# Patient Record
Sex: Female | Born: 1995 | Hispanic: Yes | Marital: Single | Smoking: Never smoker
Health system: Southern US, Community
[De-identification: ages and names within clinical notes are randomized; demographics above are authoritative.]

## PROBLEM LIST (undated history)

## (undated) ENCOUNTER — Inpatient Hospital Stay: Payer: Self-pay

## (undated) ENCOUNTER — Inpatient Hospital Stay (HOSPITAL_COMMUNITY): Payer: Self-pay

## (undated) DIAGNOSIS — K759 Inflammatory liver disease, unspecified: Secondary | ICD-10-CM

## (undated) DIAGNOSIS — N912 Amenorrhea, unspecified: Secondary | ICD-10-CM

## (undated) DIAGNOSIS — Z9189 Other specified personal risk factors, not elsewhere classified: Secondary | ICD-10-CM

## (undated) DIAGNOSIS — A6000 Herpesviral infection of urogenital system, unspecified: Secondary | ICD-10-CM

## (undated) DIAGNOSIS — O24419 Gestational diabetes mellitus in pregnancy, unspecified control: Secondary | ICD-10-CM

## (undated) DIAGNOSIS — A64 Unspecified sexually transmitted disease: Secondary | ICD-10-CM

## (undated) DIAGNOSIS — Z8619 Personal history of other infectious and parasitic diseases: Secondary | ICD-10-CM

## (undated) DIAGNOSIS — Z8739 Personal history of other diseases of the musculoskeletal system and connective tissue: Secondary | ICD-10-CM

## (undated) HISTORY — DX: Herpesviral infection of urogenital system, unspecified: A60.00

## (undated) HISTORY — DX: Amenorrhea, unspecified: N91.2

## (undated) HISTORY — DX: Personal history of other diseases of the musculoskeletal system and connective tissue: Z87.39

## (undated) HISTORY — PX: OTHER SURGICAL HISTORY: SHX169

## (undated) HISTORY — DX: Unspecified sexually transmitted disease: A64

## (undated) HISTORY — DX: Gestational diabetes mellitus in pregnancy, unspecified control: O24.419

## (undated) HISTORY — DX: Other specified personal risk factors, not elsewhere classified: Z91.89

## (undated) HISTORY — DX: Personal history of other infectious and parasitic diseases: Z86.19

---
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
## 2017-01-03 ENCOUNTER — Ambulatory Visit: Payer: Self-pay | Admitting: Obstetrics and Gynecology

## 2017-02-13 ENCOUNTER — Encounter (HOSPITAL_COMMUNITY): Payer: Self-pay

## 2018-01-25 LAB — HM HIV SCREENING LAB: HM HIV Screening: NEGATIVE

## 2018-05-28 DIAGNOSIS — Z30018 Encounter for initial prescription of other contraceptives: Secondary | ICD-10-CM | POA: Diagnosis not present

## 2018-05-28 DIAGNOSIS — Z309 Encounter for contraceptive management, unspecified: Secondary | ICD-10-CM | POA: Diagnosis not present

## 2018-10-30 DIAGNOSIS — Z62810 Personal history of physical and sexual abuse in childhood: Secondary | ICD-10-CM

## 2018-10-31 ENCOUNTER — Encounter: Payer: Self-pay | Admitting: Physician Assistant

## 2018-10-31 ENCOUNTER — Ambulatory Visit (LOCAL_COMMUNITY_HEALTH_CENTER): Payer: Medicaid Other | Admitting: Physician Assistant

## 2018-10-31 ENCOUNTER — Other Ambulatory Visit: Payer: Self-pay

## 2018-10-31 ENCOUNTER — Ambulatory Visit: Payer: Medicaid Other

## 2018-10-31 VITALS — BP 112/73 | Ht 61.0 in | Wt 127.0 lb

## 2018-10-31 DIAGNOSIS — Z30013 Encounter for initial prescription of injectable contraceptive: Secondary | ICD-10-CM

## 2018-10-31 DIAGNOSIS — Z01419 Encounter for gynecological examination (general) (routine) without abnormal findings: Secondary | ICD-10-CM

## 2018-10-31 DIAGNOSIS — Z3009 Encounter for other general counseling and advice on contraception: Secondary | ICD-10-CM

## 2018-10-31 DIAGNOSIS — Z30012 Encounter for prescription of emergency contraception: Secondary | ICD-10-CM

## 2018-10-31 DIAGNOSIS — Z113 Encounter for screening for infections with a predominantly sexual mode of transmission: Secondary | ICD-10-CM

## 2018-10-31 LAB — WET PREP FOR TRICH, YEAST, CLUE
Trichomonas Exam: NEGATIVE
Yeast Exam: NEGATIVE

## 2018-10-31 LAB — PREGNANCY, URINE: Preg Test, Ur: NEGATIVE

## 2018-10-31 MED ORDER — MEDROXYPROGESTERONE ACETATE 150 MG/ML IM SUSP
150.0000 mg | Freq: Once | INTRAMUSCULAR | Status: AC
Start: 2018-10-31 — End: 2018-10-31
  Administered 2018-10-31: 150 mg via INTRAMUSCULAR

## 2018-10-31 MED ORDER — LEVONORGESTREL 1.5 MG PO TABS: 1.5000 mg | ORAL_TABLET | Freq: Once | ORAL | 0 refills | Status: AC

## 2018-10-31 MED ORDER — VALACYCLOVIR HCL 500 MG PO TABS: 500.0000 mg | ORAL_TABLET | Freq: Every day | ORAL | 12 refills | Status: DC

## 2018-10-31 NOTE — Progress Notes (Signed)
Pregnancy test negative today. Patient given Depo today, tolerated well. ECP prescription sent to pharmacy by provider. Consents signed. Patient instructed to use condoms with all sex for next 2 weeks, and to do home pregnancy test in 2 weeks--to call [facility] if positive.Jenetta Downer, RN
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
## 2019-01-16 NOTE — Addendum Note (Signed)
Addended by: Cletis Media on: 01/16/2019 08:30 AM   Modules accepted: Orders

## 2019-02-03 ENCOUNTER — Ambulatory Visit: Payer: Medicaid Other

## 2019-02-05 ENCOUNTER — Ambulatory Visit: Payer: Medicaid Other

## 2019-02-05 DIAGNOSIS — Z20828 Contact with and (suspected) exposure to other viral communicable diseases: Secondary | ICD-10-CM | POA: Diagnosis not present

## 2019-02-05 DIAGNOSIS — B349 Viral infection, unspecified: Secondary | ICD-10-CM | POA: Diagnosis not present

## 2019-02-05 DIAGNOSIS — R0989 Other specified symptoms and signs involving the circulatory and respiratory systems: Secondary | ICD-10-CM | POA: Diagnosis not present

## 2019-03-31 DIAGNOSIS — F321 Major depressive disorder, single episode, moderate: Secondary | ICD-10-CM | POA: Diagnosis not present

## 2019-03-31 DIAGNOSIS — C189 Malignant neoplasm of colon, unspecified: Secondary | ICD-10-CM | POA: Diagnosis not present

## 2019-03-31 DIAGNOSIS — Z0001 Encounter for general adult medical examination with abnormal findings: Secondary | ICD-10-CM | POA: Diagnosis not present

## 2019-03-31 DIAGNOSIS — N898 Other specified noninflammatory disorders of vagina: Secondary | ICD-10-CM | POA: Diagnosis not present

## 2019-03-31 DIAGNOSIS — Z0000 Encounter for general adult medical examination without abnormal findings: Secondary | ICD-10-CM | POA: Diagnosis not present

## 2019-03-31 DIAGNOSIS — C50919 Malignant neoplasm of unspecified site of unspecified female breast: Secondary | ICD-10-CM | POA: Diagnosis not present

## 2019-03-31 DIAGNOSIS — Z124 Encounter for screening for malignant neoplasm of cervix: Secondary | ICD-10-CM | POA: Diagnosis not present

## 2019-03-31 DIAGNOSIS — J45909 Unspecified asthma, uncomplicated: Secondary | ICD-10-CM | POA: Diagnosis not present

## 2019-03-31 DIAGNOSIS — C539 Malignant neoplasm of cervix uteri, unspecified: Secondary | ICD-10-CM | POA: Diagnosis not present

## 2019-03-31 DIAGNOSIS — B009 Herpesviral infection, unspecified: Secondary | ICD-10-CM | POA: Diagnosis not present

## 2019-03-31 DIAGNOSIS — Z113 Encounter for screening for infections with a predominantly sexual mode of transmission: Secondary | ICD-10-CM | POA: Diagnosis not present

## 2019-03-31 DIAGNOSIS — C4490 Unspecified malignant neoplasm of skin, unspecified: Secondary | ICD-10-CM | POA: Diagnosis not present

## 2019-06-11 DIAGNOSIS — Z20822 Contact with and (suspected) exposure to covid-19: Secondary | ICD-10-CM | POA: Diagnosis not present

## 2019-06-11 DIAGNOSIS — U071 COVID-19: Secondary | ICD-10-CM | POA: Diagnosis not present

## 2019-06-11 DIAGNOSIS — R0981 Nasal congestion: Secondary | ICD-10-CM | POA: Diagnosis not present

## 2019-06-11 DIAGNOSIS — R519 Headache, unspecified: Secondary | ICD-10-CM | POA: Diagnosis not present

## 2019-07-23 DIAGNOSIS — Z111 Encounter for screening for respiratory tuberculosis: Secondary | ICD-10-CM | POA: Diagnosis not present

## 2019-07-23 DIAGNOSIS — Z021 Encounter for pre-employment examination: Secondary | ICD-10-CM | POA: Diagnosis not present

## 2019-08-11 ENCOUNTER — Ambulatory Visit: Payer: Medicaid Other | Admitting: Physician Assistant

## 2019-08-11 ENCOUNTER — Other Ambulatory Visit: Payer: Self-pay

## 2019-08-11 ENCOUNTER — Encounter: Payer: Self-pay | Admitting: Physician Assistant

## 2019-08-11 DIAGNOSIS — N760 Acute vaginitis: Secondary | ICD-10-CM | POA: Diagnosis not present

## 2019-08-11 DIAGNOSIS — B9689 Other specified bacterial agents as the cause of diseases classified elsewhere: Secondary | ICD-10-CM

## 2019-08-11 DIAGNOSIS — Z113 Encounter for screening for infections with a predominantly sexual mode of transmission: Secondary | ICD-10-CM | POA: Diagnosis not present

## 2019-08-11 LAB — WET PREP FOR TRICH, YEAST, CLUE
Trichomonas Exam: NEGATIVE
Yeast Exam: NEGATIVE

## 2019-08-11 MED ORDER — METRONIDAZOLE 0.75 % VA GEL
1.0000 | Freq: Every day | VAGINAL | 0 refills | Status: AC
Start: 2019-08-11 — End: 2019-08-18
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
## 2019-08-11 NOTE — Progress Notes (Signed)
Wet mount reviewed, patient treated for BV with metro gel, per provider orders.Burt Knack, RN

## 2019-08-11 NOTE — Progress Notes (Signed)
Here for STD testing. Plans to schedule appointment for [facility] at a later date.Burt Knack, RN

## 2019-08-28 ENCOUNTER — Ambulatory Visit (INDEPENDENT_AMBULATORY_CARE_PROVIDER_SITE_OTHER): Payer: Medicaid Other | Admitting: Obstetrics and Gynecology

## 2019-08-28 ENCOUNTER — Encounter: Payer: Self-pay | Admitting: Obstetrics and Gynecology

## 2019-08-28 ENCOUNTER — Ambulatory Visit (INDEPENDENT_AMBULATORY_CARE_PROVIDER_SITE_OTHER): Payer: Medicaid Other

## 2019-08-28 ENCOUNTER — Other Ambulatory Visit: Payer: Self-pay

## 2019-08-28 VITALS — BP 107/71 | HR 93 | Ht 61.0 in | Wt 141.0 lb

## 2019-08-28 DIAGNOSIS — Z3A01 Less than 8 weeks gestation of pregnancy: Secondary | ICD-10-CM

## 2019-08-28 DIAGNOSIS — N912 Amenorrhea, unspecified: Secondary | ICD-10-CM | POA: Diagnosis not present
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
## 2019-09-24 ENCOUNTER — Encounter: Payer: Medicaid Other | Admitting: Obstetrics and Gynecology

## 2019-10-21 ENCOUNTER — Telehealth: Payer: Self-pay | Admitting: Family Medicine

## 2019-10-21 ENCOUNTER — Ambulatory Visit: Payer: Medicaid Other | Admitting: Family Medicine

## 2019-10-21 ENCOUNTER — Other Ambulatory Visit: Payer: Self-pay

## 2019-10-21 ENCOUNTER — Encounter: Payer: Self-pay | Admitting: Family Medicine

## 2019-10-21 VITALS — BP 111/72 | Ht 61.0 in | Wt 137.2 lb

## 2019-10-21 DIAGNOSIS — Z3201 Encounter for pregnancy test, result positive: Secondary | ICD-10-CM

## 2019-10-21 DIAGNOSIS — Z113 Encounter for screening for infections with a predominantly sexual mode of transmission: Secondary | ICD-10-CM | POA: Diagnosis not present

## 2019-10-21 DIAGNOSIS — Z3200 Encounter for pregnancy test, result unknown: Secondary | ICD-10-CM

## 2019-10-21 LAB — WET PREP FOR TRICH, YEAST, CLUE
Trichomonas Exam: NEGATIVE
Yeast Exam: NEGATIVE

## 2019-10-21 LAB — PREGNANCY, URINE: Preg Test, Ur: POSITIVE — AB

## 2019-10-21 MED ORDER — CLOTRIMAZOLE 1 % EX CREA: TOPICAL_CREAM | Freq: Two times a day (BID) | CUTANEOUS | Status: AC
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
## 2019-10-21 NOTE — Telephone Encounter (Signed)
TC to patient, returning her call. Patient was here for PT today and looking for [facility]. Patient states she was told by [facility] she would need a referral from [facility] to start [facility]. Patient states she has made an appointment at a different clinic and has no further questions.Marland KitchenMarland KitchenBurt Knack, RN

## 2019-10-21 NOTE — Telephone Encounter (Signed)
[facility] told pt she need a referral from [place] for OB care [phone]

## 2019-10-29 DIAGNOSIS — Z3401 Encounter for supervision of normal first pregnancy, first trimester: Secondary | ICD-10-CM | POA: Diagnosis not present

## 2019-10-29 DIAGNOSIS — O3680X1 Pregnancy with inconclusive fetal viability, fetus 1: Secondary | ICD-10-CM | POA: Diagnosis not present

## 2019-11-13 DIAGNOSIS — N6042 Mammary duct ectasia of left breast: Secondary | ICD-10-CM | POA: Diagnosis not present

## 2019-11-14 ENCOUNTER — Ambulatory Visit (INDEPENDENT_AMBULATORY_CARE_PROVIDER_SITE_OTHER): Payer: Medicaid Other | Admitting: Obstetrics and Gynecology

## 2019-11-14 ENCOUNTER — Encounter: Payer: Self-pay | Admitting: Obstetrics and Gynecology

## 2019-11-14 ENCOUNTER — Other Ambulatory Visit: Payer: Self-pay

## 2019-11-14 VITALS — BP 110/72 | HR 90 | Ht 61.0 in | Wt 131.8 lb

## 2019-11-14 DIAGNOSIS — N6002 Solitary cyst of left breast: Secondary | ICD-10-CM

## 2019-11-14 DIAGNOSIS — N912 Amenorrhea, unspecified: Secondary | ICD-10-CM | POA: Diagnosis not present

## 2019-11-14 LAB — POCT URINE PREGNANCY: Preg Test, Ur: POSITIVE — AB
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
## 2019-11-18 ENCOUNTER — Ambulatory Visit (INDEPENDENT_AMBULATORY_CARE_PROVIDER_SITE_OTHER): Payer: Medicaid Other

## 2019-11-18 DIAGNOSIS — Z3A09 9 weeks gestation of pregnancy: Secondary | ICD-10-CM | POA: Diagnosis not present

## 2019-11-18 DIAGNOSIS — N912 Amenorrhea, unspecified: Secondary | ICD-10-CM | POA: Diagnosis not present

## 2019-12-11 ENCOUNTER — Encounter: Payer: Medicaid Other | Admitting: Obstetrics and Gynecology

## 2019-12-11 ENCOUNTER — Encounter: Payer: Self-pay | Admitting: Obstetrics and Gynecology

## 2019-12-12 ENCOUNTER — Encounter: Payer: Medicaid Other | Admitting: Obstetrics and Gynecology

## 2019-12-23 DIAGNOSIS — M791 Myalgia, unspecified site: Secondary | ICD-10-CM | POA: Diagnosis not present

## 2019-12-23 DIAGNOSIS — L210 Seborrhea capitis: Secondary | ICD-10-CM | POA: Diagnosis not present

## 2019-12-30 ENCOUNTER — Encounter: Payer: Self-pay | Admitting: Obstetrics and Gynecology

## 2019-12-31 ENCOUNTER — Encounter: Payer: Self-pay | Admitting: Physician Assistant

## 2019-12-31 ENCOUNTER — Other Ambulatory Visit: Payer: Self-pay

## 2019-12-31 ENCOUNTER — Ambulatory Visit: Payer: Medicaid Other | Admitting: Physician Assistant

## 2019-12-31 DIAGNOSIS — Z113 Encounter for screening for infections with a predominantly sexual mode of transmission: Secondary | ICD-10-CM | POA: Diagnosis not present

## 2019-12-31 LAB — WET PREP FOR TRICH, YEAST, CLUE
Trichomonas Exam: NEGATIVE
Yeast Exam: NEGATIVE

## 2019-12-31 NOTE — Progress Notes (Signed)
Wet [place] results reviewed. Per standing orders no treatment indicated. Raeleigh Guinn, RN  

## 2020-01-01 ENCOUNTER — Encounter: Payer: Self-pay | Admitting: Physician Assistant
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
## 2020-03-03 DIAGNOSIS — R300 Dysuria: Secondary | ICD-10-CM | POA: Diagnosis not present

## 2020-03-03 DIAGNOSIS — Z113 Encounter for screening for infections with a predominantly sexual mode of transmission: Secondary | ICD-10-CM | POA: Diagnosis not present

## 2020-03-12 DIAGNOSIS — R319 Hematuria, unspecified: Secondary | ICD-10-CM | POA: Diagnosis not present

## 2020-03-12 DIAGNOSIS — S299XXA Unspecified injury of thorax, initial encounter: Secondary | ICD-10-CM | POA: Diagnosis not present

## 2020-03-12 DIAGNOSIS — T7491XA Unspecified adult maltreatment, confirmed, initial encounter: Secondary | ICD-10-CM | POA: Diagnosis not present

## 2020-04-03 DIAGNOSIS — H6982 Other specified disorders of Eustachian tube, left ear: Secondary | ICD-10-CM | POA: Diagnosis not present

## 2020-04-03 DIAGNOSIS — Z3200 Encounter for pregnancy test, result unknown: Secondary | ICD-10-CM | POA: Diagnosis not present

## 2020-04-03 DIAGNOSIS — J069 Acute upper respiratory infection, unspecified: Secondary | ICD-10-CM | POA: Diagnosis not present

## 2020-04-08 ENCOUNTER — Ambulatory Visit (LOCAL_COMMUNITY_HEALTH_CENTER): Payer: Medicaid Other

## 2020-04-08 ENCOUNTER — Other Ambulatory Visit: Payer: Self-pay

## 2020-04-08 VITALS — BP 110/80 | Ht 61.0 in | Wt 122.0 lb

## 2020-04-08 DIAGNOSIS — Z3201 Encounter for pregnancy test, result positive: Secondary | ICD-10-CM

## 2020-04-08 LAB — PREGNANCY, URINE: Preg Test, Ur: POSITIVE — AB

## 2020-04-08 MED ORDER — PRENATAL VITAMINS 28-0.8 MG PO TABS: 28.0000 mg | ORAL_TABLET | Freq: Every day | ORAL | 0 refills | Status: DC

## 2020-04-09 NOTE — Progress Notes (Signed)
+  PT today.  Plans [facility] at [facility] Campbell, RN

## 2020-04-28 ENCOUNTER — Other Ambulatory Visit (HOSPITAL_COMMUNITY)
Admission: RE | Admit: 2020-04-28 | Discharge: 2020-04-28 | Disposition: A | Discharge status: Home / Self Care | Payer: Medicaid Other | Source: Ambulatory Visit | Attending: Certified Nurse Midwife | Admitting: Certified Nurse Midwife

## 2020-04-28 ENCOUNTER — Ambulatory Visit (INDEPENDENT_AMBULATORY_CARE_PROVIDER_SITE_OTHER): Payer: Medicaid Other | Admitting: Certified Nurse Midwife

## 2020-04-28 ENCOUNTER — Other Ambulatory Visit: Payer: Self-pay

## 2020-04-28 ENCOUNTER — Ambulatory Visit (INDEPENDENT_AMBULATORY_CARE_PROVIDER_SITE_OTHER): Payer: Medicaid Other

## 2020-04-28 ENCOUNTER — Encounter: Payer: Self-pay | Admitting: Certified Nurse Midwife

## 2020-04-28 VITALS — BP 105/63 | HR 73 | Wt 120.4 lb

## 2020-04-28 DIAGNOSIS — N912 Amenorrhea, unspecified: Secondary | ICD-10-CM | POA: Diagnosis not present

## 2020-04-28 DIAGNOSIS — N898 Other specified noninflammatory disorders of vagina: Secondary | ICD-10-CM | POA: Insufficient documentation

## 2020-04-28 MED ORDER — DOXYLAMINE-PYRIDOXINE 10-10 MG PO TBEC: 1.0000 | DELAYED_RELEASE_TABLET | Freq: Four times a day (QID) | ORAL | 5 refills | Status: DC

## 2020-04-28 NOTE — Progress Notes (Signed)
Pt c/o increased yellow/white discharge, foul odor x 2 weeks Has used boric acid

## 2020-04-28 NOTE — Patient Instructions (Signed)

## 2020-04-28 NOTE — Progress Notes (Signed)
Subjective:    Angela Hicks is a 24 y.o. female who presents for evaluation of amenorrhea. Positive pregnancy test at the health department.. Pregnancy is desired. Sexual Activity: single partner, contraception: none. Current symptoms also include: fatigue, nausea and Vaginal discharge with odor. Has hx recurrent BV.Marland Kitchen Last period was normal.   No LMP recorded. The following portions of the patient's history were reviewed and updated as appropriate: allergies, current medications, past family history, past medical history, past social history, past surgical history and problem list.  Review of Systems Pertinent items are noted in HPI.     Objective:    BP 105/63   Pulse 73   Wt 120 lb 6.4 oz (54.6 kg)   BMI 22.75 kg/m  General: alert, cooperative, appears stated age and no acute distress    Lab Review Urine HCG: positive @ health department    Assessment:    Absence of menstruation.     Plan:  EDC: 12/16/20  . Sawb collected. Briefly discussed pre-natal care options. MD and midwifery care reviewed. Plans to see midwives. Encouraged well-balanced diet, plenty of rest when needed, pre-natal vitamins daily and walking for exercise. Discussed self-help for nausea (diclegis ordered), avoiding OTC medications until consulting provider or pharmacist, other than Tylenol as needed, minimal caffeine (1-2 cups daily) and avoiding alcohol. She will schedule u/s for dating,  her nurse visit is scheduled, her initial OB visit 12-13 wks.. Feel free to call with any questions.    Doreene Burke, CNM

## 2020-04-29 LAB — CERVICOVAGINAL ANCILLARY ONLY
Bacterial Vaginitis (gardnerella): POSITIVE — AB
Candida Glabrata: NEGATIVE
Candida Vaginitis: POSITIVE — AB
Chlamydia: NEGATIVE
Comment: NEGATIVE
Comment: NEGATIVE
Comment: NEGATIVE
Comment: NEGATIVE
Comment: NEGATIVE
Comment: NORMAL
Neisseria Gonorrhea: NEGATIVE
Trichomonas: NEGATIVE

## 2020-05-03 ENCOUNTER — Other Ambulatory Visit: Payer: Self-pay | Admitting: Certified Nurse Midwife

## 2020-05-03 ENCOUNTER — Other Ambulatory Visit: Payer: Self-pay

## 2020-05-03 ENCOUNTER — Telehealth: Payer: Self-pay

## 2020-05-03 MED ORDER — METRONIDAZOLE 0.75 % VA GEL: 1.0000 | Freq: Every day | VAGINAL | 1 refills | Status: DC

## 2020-05-03 MED ORDER — MICONAZOLE NITRATE 2 % VA CREA: 1.0000 | TOPICAL_CREAM | Freq: Every day | VAGINAL | 0 refills | Status: AC

## 2020-05-03 MED ORDER — METRONIDAZOLE 0.75 % EX GEL: 1.0000 "application " | Freq: Every day | CUTANEOUS | 0 refills | Status: AC

## 2020-05-03 NOTE — Telephone Encounter (Signed)
Pharmacy called in and stated that they received a request for miconazole and one for metronidazole. The pharmacy is wanting to know which one to fill.  Please advise.

## 2020-05-03 NOTE — Progress Notes (Signed)
Swab positive for BV and yeast. Orders placed for treatment.   Jahmarion Popoff, CNM  

## 2020-05-03 NOTE — Telephone Encounter (Signed)
This pt has yeast and BV so I ordered metrogel for BV,  And miconazole for the yeast. Not sure why the are asking which one to fill she needs both. No doing oral diflucan cause she is early in preg. Not recommended.  She will need to complete full course for BV, then due the miconazole after.  Can you follow up with her pharmacy.    Barnabas Lister

## 2020-05-04 ENCOUNTER — Telehealth: Payer: Self-pay

## 2020-05-04 ENCOUNTER — Other Ambulatory Visit: Payer: Self-pay | Admitting: Certified Nurse Midwife

## 2020-05-04 MED ORDER — ONDANSETRON 4 MG PO TBDP: 4.0000 mg | ORAL_TABLET | Freq: Three times a day (TID) | ORAL | 0 refills | Status: DC | PRN

## 2020-05-04 NOTE — Telephone Encounter (Signed)
mychart message sent to patient- zofran dissolving tablets sent to pharmacy listed per Doreene Burke CNM

## 2020-05-11 ENCOUNTER — Ambulatory Visit: Payer: Medicaid Other

## 2020-05-11 ENCOUNTER — Other Ambulatory Visit: Payer: Self-pay

## 2020-05-11 ENCOUNTER — Telehealth: Payer: Self-pay

## 2020-05-11 VITALS — BP 110/61 | HR 85 | Ht 61.0 in | Wt 120.2 lb

## 2020-05-11 DIAGNOSIS — Z3A08 8 weeks gestation of pregnancy: Secondary | ICD-10-CM

## 2020-05-11 NOTE — Progress Notes (Signed)
Saunders Glance presents for NOB nurse interview visit. Pregnancy confirmation done 04/28/20 Doreene Burke CNM. LMP 03/11/20. Dating and viability ultrasound done at [facility] 04/28/20- 6 weeks 3 days EDD 12/19/20.  G-3 P1 0 1 1. Pregnancy education material explained and given. 0 cats in the home. NOB labs ordered. HIV labs and Drug screen were explained optional and she did not decline. Drug screen ordered. PNV encouraged. Genetic screening options discussed. Genetic testing: will be ordered at NOB PE.  Pt may discuss with provider. Pt. To follow up with provider in _4_ weeks for NOB physical.  All questions answered. FMLA paperwork signed. Financial policy reviewed and understood.

## 2020-05-11 NOTE — Telephone Encounter (Signed)
mychart message sent to patient re: no visitors 

## 2020-05-12 LAB — URINALYSIS, ROUTINE W REFLEX MICROSCOPIC
Bilirubin, UA: NEGATIVE
Glucose, UA: NEGATIVE
Ketones, UA: NEGATIVE
Nitrite, UA: NEGATIVE
RBC, UA: NEGATIVE
Specific Gravity, UA: 1.025 (ref 1.005–1.030)
Urobilinogen, Ur: 0.2 mg/dL (ref 0.2–1.0)
pH, UA: 8.5 — ABNORMAL HIGH (ref 5.0–7.5)

## 2020-05-12 LAB — MICROSCOPIC EXAMINATION
Bacteria, UA: NONE SEEN
Casts: NONE SEEN /lpf
Epithelial Cells (non renal): >10 /hpf — AB (ref 0–10)

## 2020-05-12 LAB — HCV INTERPRETATION

## 2020-05-12 LAB — ABO AND RH: Rh Factor: POSITIVE

## 2020-05-12 LAB — VARICELLA ZOSTER ANTIBODY, IGG: Varicella zoster IgG: 351 index (ref >165)

## 2020-05-12 LAB — VIRAL HEPATITIS HBV, HCV
HCV Ab: <0.1 s/co ratio (ref 0.0–0.9)
Hep B Core Total Ab: NEGATIVE
Hep B Surface Ab, Qual: REACTIVE
Hepatitis B Surface Ag: NEGATIVE

## 2020-05-12 LAB — HIV ANTIBODY (ROUTINE TESTING W REFLEX): HIV Screen 4th Generation wRfx: NONREACTIVE

## 2020-05-12 LAB — ANTIBODY SCREEN: Antibody Screen: NEGATIVE

## 2020-05-12 LAB — RPR: RPR Ser Ql: NONREACTIVE

## 2020-05-12 LAB — RUBELLA SCREEN: Rubella Antibodies, IGG: 3.25 index (ref >0.99)

## 2020-05-13 ENCOUNTER — Other Ambulatory Visit: Payer: Self-pay | Admitting: Certified Nurse Midwife

## 2020-05-13 DIAGNOSIS — Z3481 Encounter for supervision of other normal pregnancy, first trimester: Secondary | ICD-10-CM

## 2020-05-13 DIAGNOSIS — R8271 Bacteriuria: Secondary | ICD-10-CM | POA: Insufficient documentation

## 2020-05-13 DIAGNOSIS — Z3A08 8 weeks gestation of pregnancy: Secondary | ICD-10-CM

## 2020-05-13 LAB — URINE CULTURE

## 2020-05-13 LAB — GC/CHLAMYDIA PROBE AMP
Chlamydia trachomatis, NAA: NEGATIVE
Neisseria Gonorrhoeae by PCR: NEGATIVE

## 2020-05-13 MED ORDER — AMPICILLIN 500 MG PO CAPS: 500.0000 mg | ORAL_CAPSULE | Freq: Four times a day (QID) | ORAL | 0 refills | Status: AC

## 2020-05-27 LAB — CANNABINOID (GC/MS), URINE
Cannabinoid: POSITIVE — AB
Carboxy THC (GC/MS): 137 ng/mL

## 2020-05-27 LAB — MONITOR DRUG PROFILE 14(MW)
Amphetamine Scrn, Ur: NEGATIVE ng/mL
BARBITURATE SCREEN URINE: NEGATIVE ng/mL
BENZODIAZEPINE SCREEN, URINE: NEGATIVE ng/mL
Buprenorphine, Urine: NEGATIVE ng/mL
Cocaine (Metab) Scrn, Ur: NEGATIVE ng/mL
Creatinine(Crt), U: 197.4 mg/dL (ref 20.0–300.0)
Fentanyl, Urine: NEGATIVE pg/mL
Meperidine Screen, Urine: NEGATIVE ng/mL
Methadone Screen, Urine: NEGATIVE ng/mL
OXYCODONE+OXYMORPHONE UR QL SCN: NEGATIVE ng/mL
Opiate Scrn, Ur: NEGATIVE ng/mL
Ph of Urine: 8.9 (ref 4.5–8.9)
Phencyclidine Qn, Ur: NEGATIVE ng/mL
Propoxyphene Scrn, Ur: NEGATIVE ng/mL
SPECIFIC GRAVITY: 1.028
Tramadol Screen, Urine: NEGATIVE ng/mL

## 2020-06-03 DIAGNOSIS — Z3A11 11 weeks gestation of pregnancy: Secondary | ICD-10-CM | POA: Diagnosis not present

## 2020-06-03 DIAGNOSIS — N83291 Other ovarian cyst, right side: Secondary | ICD-10-CM | POA: Diagnosis not present

## 2020-06-03 DIAGNOSIS — R1030 Lower abdominal pain, unspecified: Secondary | ICD-10-CM | POA: Diagnosis not present

## 2020-06-03 DIAGNOSIS — O26899 Other specified pregnancy related conditions, unspecified trimester: Secondary | ICD-10-CM | POA: Diagnosis not present

## 2020-06-03 DIAGNOSIS — B3789 Other sites of candidiasis: Secondary | ICD-10-CM | POA: Diagnosis not present

## 2020-06-03 DIAGNOSIS — R1031 Right lower quadrant pain: Secondary | ICD-10-CM | POA: Diagnosis not present

## 2020-06-03 DIAGNOSIS — O210 Mild hyperemesis gravidarum: Secondary | ICD-10-CM | POA: Diagnosis not present

## 2020-06-03 DIAGNOSIS — O219 Vomiting of pregnancy, unspecified: Secondary | ICD-10-CM | POA: Diagnosis not present

## 2020-06-03 DIAGNOSIS — M545 Low back pain, unspecified: Secondary | ICD-10-CM | POA: Diagnosis not present

## 2020-06-03 DIAGNOSIS — O99891 Other specified diseases and conditions complicating pregnancy: Secondary | ICD-10-CM | POA: Diagnosis not present

## 2020-06-03 DIAGNOSIS — O23591 Infection of other part of genital tract in pregnancy, first trimester: Secondary | ICD-10-CM | POA: Diagnosis not present

## 2020-06-03 DIAGNOSIS — R109 Unspecified abdominal pain: Secondary | ICD-10-CM | POA: Diagnosis not present

## 2020-06-03 DIAGNOSIS — N898 Other specified noninflammatory disorders of vagina: Secondary | ICD-10-CM | POA: Diagnosis not present

## 2020-06-08 ENCOUNTER — Encounter: Payer: Self-pay | Admitting: Certified Nurse Midwife

## 2020-06-08 ENCOUNTER — Other Ambulatory Visit: Payer: Self-pay

## 2020-06-08 ENCOUNTER — Ambulatory Visit (INDEPENDENT_AMBULATORY_CARE_PROVIDER_SITE_OTHER): Payer: Medicaid Other | Admitting: Certified Nurse Midwife

## 2020-06-08 VITALS — BP 110/69 | HR 95 | Wt 116.2 lb

## 2020-06-08 DIAGNOSIS — Z3482 Encounter for supervision of other normal pregnancy, second trimester: Secondary | ICD-10-CM | POA: Diagnosis not present

## 2020-06-08 DIAGNOSIS — N390 Urinary tract infection, site not specified: Secondary | ICD-10-CM | POA: Diagnosis not present

## 2020-06-08 DIAGNOSIS — Z3A12 12 weeks gestation of pregnancy: Secondary | ICD-10-CM | POA: Diagnosis not present

## 2020-06-08 LAB — POCT URINALYSIS DIPSTICK OB
Bilirubin, UA: NEGATIVE
Blood, UA: NEGATIVE
Glucose, UA: NEGATIVE
Ketones, UA: NEGATIVE
Leukocytes, UA: NEGATIVE
Nitrite, UA: NEGATIVE
POC,PROTEIN,UA: NEGATIVE
Spec Grav, UA: 1.01 (ref 1.010–1.025)
Urobilinogen, UA: 0.2 E.U./dL
pH, UA: 5 (ref 5.0–8.0)

## 2020-06-08 MED ORDER — FUSION PLUS PO CAPS: 1.0000 | ORAL_CAPSULE | Freq: Every day | ORAL | 11 refills | Status: DC

## 2020-06-08 NOTE — Progress Notes (Signed)
NEW OB HISTORY AND PHYSICAL  SUBJECTIVE:       Angela Hicks is a 25 y.o. G69P1011 female, Patient's last menstrual period was 03/11/2020 (exact date)., Estimated Date of Delivery: 12/19/20, [redacted]w[redacted]d, presents today for establishment of Prenatal Care. She and complains of nausea. Was seen a [facility] recently and given IV hydration.     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   Gynecologic History Patient's last menstrual period was 03/11/2020 (exact date). Normal Contraception: none Last Pap: 03/31/19. Results were: normal  Obstetric History OB History  Gravida Para Term Preterm AB Living  3 1 1  0 1 1  SAB IAB Ectopic Multiple Live Births  1 0 0 0 1    # Outcome Date GA Lbr Len/2nd Weight Sex Delivery Anes PTL Lv  3 Current           2 SAB 2021 [redacted]w[redacted]d         1 Term 11/20/16 [redacted]w[redacted]d  7 lb 2.5 oz (3.246 kg) F Vag-Spont EPI N LIV    Past Medical History:  Diagnosis Date  . Amenorrhea   . At risk for sexually transmitted disease due to partner with genital symptoms    partner tested positive, pt tested negative  . Genital herpes   . Hepatitis    valtrex x 3 days  . History of TMJ disorder   . Hx of chlamydia infection   . STD (sexually transmitted disease)     Past Surgical History:  Procedure Laterality Date  . none      Current Outpatient Medications on File Prior to Visit  Medication Sig Dispense Refill  . clotrimazole (GYNE-LOTRIMIN) 1 % vaginal cream Place vaginally.    . Prenatal MV & Min w/FA-DHA (PRENATAL GUMMIES PO) Take by mouth.    . Doxylamine-Pyridoxine 10-10 MG TBEC Take 1 tablet by mouth 4 (four) times daily. Day 1 &2: 2 tablet at bedtimeDay 3 : if symptoms persists 1 tablet am; 2 tablet at bedtimeDay 4: 1 tablet am, 1 tab afternoon, 2 tab at bedtime (Patient not taking: Reported on  06/08/2020) 120 tablet 5  . ondansetron (ZOFRAN ODT) 4 MG disintegrating tablet Take 1 tablet (4 mg total) by mouth every 8 (eight) hours as needed for nausea or vomiting. (Patient not taking: No sig reported) 20 tablet 0   No current facility-administered medications on file prior to visit.    No Known Allergies  Social History   Socioeconomic History  . Marital status: Single    Spouse name: Not on file  . Number of children: Not on file  . Years of education: Not on file  . Highest education level: Not on file  Occupational History  . Not on file  Tobacco Use  . Smoking status: Never Smoker  . Smokeless tobacco: Never Used  Vaping Use  . Vaping Use: Never used  Substance and Sexual Activity  . Alcohol use: Not Currently  . Drug use: No  . Sexual activity: Yes    Partners: Male    Birth control/protection: None  Other Topics Concern  . Not on file  Social History Narrative  . Not on file   Social Determinants of Health   Financial Resource Strain: Not on file  Food Insecurity: Not on file  Transportation Needs: Not on file  Physical Activity: Not on file  Stress: Not on file  Social Connections: Not on file  Intimate Partner Violence: Not At Risk  . Fear of Current or Ex-Partner: No  . Emotionally Abused: No  . Physically Abused: No  . Sexually Abused: No    Family History  Problem Relation Age of Onset  . Hypertension Mother   . Migraines Mother   . Hyperlipidemia Mother   . Diabetes Mother   . Thyroid disease Father   . Cirrhosis Father   . Migraines Sister   . Breast cancer Maternal Aunt 40  . Migraines Paternal Aunt   . Breast cancer Maternal Grandmother   . Ovarian cancer Maternal Grandmother   . Cancer Maternal Grandfather        colon  . Lung cancer Paternal Grandfather     The following portions of the patient's history were reviewed and updated as appropriate: allergies, current medications, past OB history, past medical history, past  surgical history, past family history, past social history, and problem list.    OBJECTIVE: Initial Physical Exam (New OB)  GENERAL APPEARANCE: alert, well appearing, in no apparent distress, oriented to person, place and time HEAD: normocephalic, atraumatic MOUTH: mucous membranes moist, pharynx normal without lesions THYROID: no thyromegaly or masses present BREASTS: no masses noted, no significant tenderness, no palpable axillary nodes, no skin changes LUNGS: clear to auscultation, no wheezes, rales or rhonchi, symmetric air entry HEART: regular rate and rhythm, no murmurs ABDOMEN: soft, nontender, nondistended, no abnormal masses, no epigastric pain and FHT present EXTREMITIES: no redness or tenderness in the calves or thighs, no edema, no limitation in range of motion, intact peripheral pulses SKIN: normal coloration and turgor, no rashes LYMPH NODES: no adenopathy palpable NEUROLOGIC: alert, oriented, normal speech, no focal findings or movement disorder noted  PELVIC EXAM EXTERNAL GENITALIA: normal appearing vulva with no masses, tenderness or lesions VAGINA: no abnormal discharge or lesions CERVIX: no lesions or cervical motion tenderness UTERUS: gravid ADNEXA: no masses palpable and nontender OB EXAM PELVIMETRY: appears adequate RECTUM: exam not indicated  ASSESSMENT: Normal pregnancy  PLAN: New OB counseling: The patient has been given an overview regarding routine prenatal care. Recommendations regarding diet, weight gain, and exercise in pregnancy were given. Prenatal testing, optional genetic testing, carrier screening testing, and ultrasound use in pregnancy were reviewed.  [place] today . Benefits of Breast Feeding were discussed. The patient is encouraged to consider nursing her baby post partum. State she did it x 1 month with her first baby. Plans to do it longer. Follow up 4 wks with Marcelino Duster.   Doreene Burke, CNM

## 2020-06-08 NOTE — Patient Instructions (Signed)
https://www.acog.org/womens-health/faqs/prenatal-genetic-screening-tests">  Prenatal Care Prenatal care is health care during pregnancy. It helps you and your unborn baby (fetus) stay as healthy as possible. Prenatal care may be provided by a midwife, a family practice doctor, a mid-level practitioner (nurse practitioner or physician assistant), or a childbirth and pregnancy doctor (obstetrician). How does this affect me? During pregnancy, you will be closely monitored for any new conditions that might develop. To lower your risk of pregnancy complications, you and your health care provider will talk about any underlying conditions you have. How does this affect my baby? Early and consistent prenatal care increases the chance that your baby will be healthy during pregnancy. Prenatal care lowers the risk that your baby will be:  Born early (prematurely).  Smaller than expected at birth (small for gestational age). What can I expect at the first prenatal care visit? Your first prenatal care visit will likely be the longest. You should schedule your first prenatal care visit as soon as you know that you are pregnant. Your first visit is a good time to talk about any questions or concerns you have about pregnancy. Medical history At your visit, you and your health care provider will talk about your medical history, including:  Any past pregnancies.  Your family's medical history.  Medical history of the baby's father.  Any long-term (chronic) health conditions you have and how you manage them.  Any surgeries or procedures you have had.  Any current over-the-counter or prescription medicines, herbs, or supplements that you are taking.  Other factors that could pose a risk to your baby, including: ? Exposure to harmful chemicals or radiation at work or at home. ? Any substance use, including tobacco, alcohol, and drug use.  Your home setting and your stress levels, including: ? Exposure to  abuse or violence. ? Household financial strain.  Your daily health habits, including diet and exercise. Tests and screenings Your health care provider will:  Measure your weight, height, and blood pressure.  Do a physical exam, including a pelvic and breast exam.  Perform blood tests and urine tests to check for: ? Urinary tract infection. ? Sexually transmitted infections (STIs). ? Low iron levels in your blood (anemia). ? Blood type and certain proteins on red blood cells (Rh antibodies). ? Infections and immunity to viruses, such as hepatitis B and rubella. ? HIV (human immunodeficiency virus).  Discuss your options for genetic screening. Tips about staying healthy Your health care provider will also give you information about how to keep yourself and your baby healthy, including:  Nutrition and taking vitamins.  Physical activity.  How to manage pregnancy symptoms such as nausea and vomiting (morning sickness).  Infections and substances that may be harmful to your baby and how to avoid them.  Food safety.  Dental care.  Working.  Travel.  Warning signs to watch for and when to call your health care provider. How often will I have prenatal care visits? After your first prenatal care visit, you will have regular visits throughout your pregnancy. The visit schedule is often as follows:  Up to week 28 of pregnancy: once every 4 weeks.  28-36 weeks: once every 2 weeks.  After 36 weeks: every week until delivery. Some women may have visits more or less often depending on any underlying health conditions and the health of the baby. Keep all follow-up and prenatal care visits. This is important. What happens during routine prenatal care visits? Your health care provider will:  Measure your weight   and blood pressure.  Check for fetal heart sounds.  Measure the height of your uterus in your abdomen (fundal height). This may be measured starting around week 20 of  pregnancy.  Check the position of your baby inside your uterus.  Ask questions about your diet, sleeping patterns, and whether you can feel the baby move.  Review warning signs to watch for and signs of labor.  Ask about any pregnancy symptoms you are having and how you are dealing with them. Symptoms may include: ? Headaches. ? Nausea and vomiting. ? Vaginal discharge. ? Swelling. ? Fatigue. ? Constipation. ? Changes in your vision. ? Feeling persistently sad or anxious. ? Any discomfort, including back or pelvic pain. ? Bleeding or spotting. Make a list of questions to ask your health care provider at your routine visits.   What tests might I have during prenatal care visits? You may have blood, urine, and imaging tests throughout your pregnancy, such as:  Urine tests to check for glucose, protein, or signs of infection.  Glucose tests to check for a form of diabetes that can develop during pregnancy (gestational diabetes mellitus). This is usually done around week 24 of pregnancy.  Ultrasounds to check your baby's growth and development, to check for birth defects, and to check your baby's well-being. These can also help to decide when you should deliver your baby.  A test to check for group B strep (GBS) infection. This is usually done around week 36 of pregnancy.  Genetic testing. This may include blood, fluid, or tissue sampling, or imaging tests, such as an ultrasound. Some genetic tests are done during the first trimester and some are done during the second trimester. What else can I expect during prenatal care visits? Your health care provider may recommend getting certain vaccines during pregnancy. These may include:  A yearly flu shot (annual influenza vaccine). This is especially important if you will be pregnant during flu season.  Tdap (tetanus, diphtheria, pertussis) vaccine. Getting this vaccine during pregnancy can protect your baby from whooping cough  (pertussis) after birth. This vaccine may be recommended between weeks 27 and 36 of pregnancy.  A COVID-19 vaccine. Later in your pregnancy, your health care provider may give you information about:  Childbirth and breastfeeding classes.  Choosing a health care provider for your baby.  Umbilical cord banking.  Breastfeeding.  Birth control after your baby is born.  The hospital labor and delivery unit and how to set up a tour.  Registering at the hospital before you go into labor. Where to find more information  Office on Women's Health: womenshealth.gov  American Pregnancy Association: americanpregnancy.org  March of Dimes: marchofdimes.org Summary  Prenatal care helps you and your baby stay as healthy as possible during pregnancy.  Your first prenatal care visit will most likely be the longest.  You will have visits and tests throughout your pregnancy to monitor your health and your baby's health.  Bring a list of questions to your visits to ask your health care provider.  Make sure to keep all follow-up and prenatal care visits. This information is not intended to replace advice given to you by your health care provider. Make sure you discuss any questions you have with your health care provider. Document Revised: 01/29/2020 Document Reviewed: 01/29/2020 Elsevier Patient Education  2021 Elsevier Inc.  

## 2020-06-09 LAB — CBC
Hematocrit: 34.7 % (ref 34.0–46.6)
Hemoglobin: 10.9 g/dL — ABNORMAL LOW (ref 11.1–15.9)
MCH: 23.4 pg — ABNORMAL LOW (ref 26.6–33.0)
MCHC: 31.4 g/dL — ABNORMAL LOW (ref 31.5–35.7)
MCV: 75 fL — ABNORMAL LOW (ref 79–97)
Platelets: 284 10*3/uL (ref 150–450)
RBC: 4.66 x10E6/uL (ref 3.77–5.28)
RDW: 17.4 % — ABNORMAL HIGH (ref 11.7–15.4)
WBC: 9.3 10*3/uL (ref 3.4–10.8)

## 2020-06-10 LAB — URINE CULTURE

## 2020-07-09 ENCOUNTER — Ambulatory Visit (INDEPENDENT_AMBULATORY_CARE_PROVIDER_SITE_OTHER): Payer: Medicaid Other | Admitting: Certified Nurse Midwife

## 2020-07-09 ENCOUNTER — Other Ambulatory Visit: Payer: Self-pay

## 2020-07-09 ENCOUNTER — Encounter: Payer: Self-pay | Admitting: Certified Nurse Midwife

## 2020-07-09 VITALS — BP 105/64 | HR 94 | Wt 121.8 lb

## 2020-07-09 DIAGNOSIS — Z87440 Personal history of urinary (tract) infections: Secondary | ICD-10-CM

## 2020-07-09 DIAGNOSIS — Z3492 Encounter for supervision of normal pregnancy, unspecified, second trimester: Secondary | ICD-10-CM

## 2020-07-09 DIAGNOSIS — Z3A16 16 weeks gestation of pregnancy: Secondary | ICD-10-CM

## 2020-07-09 DIAGNOSIS — Z3689 Encounter for other specified antenatal screening: Secondary | ICD-10-CM

## 2020-07-09 LAB — POCT URINALYSIS DIPSTICK OB
Bilirubin, UA: NEGATIVE
Blood, UA: NEGATIVE
Glucose, UA: NEGATIVE
Ketones, UA: NEGATIVE
Leukocytes, UA: NEGATIVE
Nitrite, UA: NEGATIVE
POC,PROTEIN,UA: NEGATIVE
Spec Grav, UA: 1.015
Urobilinogen, UA: 0.2 U/dL
pH, UA: 7.5

## 2020-07-09 NOTE — Progress Notes (Signed)
ROB-Reports intermittent cramping, tachycardia, and pelvic pressure. Discussed home treatment measures including use of abdominal support. Emphasized the importance of eating and hydration during pregnancy. Urine culture today, due to history of UTI at intake. Anticipatory guidance regarding course of prenatal care. Reviewed red flag symptoms and when to call. RTC x 4 weeks for ANATOMY SCAN and ROB or sooner if needed.

## 2020-07-09 NOTE — Patient Instructions (Signed)
Second Trimester of Pregnancy  The second trimester of pregnancy is from week 13 through week 27. This is also called months 4 through 6 of pregnancy. This is often the time when you feel your best. During the second trimester:  Morning sickness is less or has stopped.  You may have more energy.  You may feel hungry more often. At this time, your unborn baby (fetus) is growing very fast. At the end of the sixth month, the unborn baby may be up to 12 inches long and weigh about 1 pounds. You will likely start to feel the baby move between 16 and 20 weeks of pregnancy. Body changes during your second trimester Your body continues to go through many changes during this time. The changes vary and generally return to normal after the baby is born. Physical changes  You will gain more weight.  You may start to get stretch marks on your hips, belly (abdomen), and breasts.  Your breasts will grow and may hurt.  Dark spots or blotches may develop on your face.  A dark line from your belly button to the pubic area (linea nigra) may appear.  You may have changes in your hair. Health changes  You may have headaches.  You may have heartburn.  You may have trouble pooping (constipation).  You may have hemorrhoids or swollen, bulging veins (varicose veins).  Your gums may bleed.  You may pee (urinate) more often.  You may have back pain. Follow these instructions at home: Medicines  Take over-the-counter and prescription medicines only as told by your doctor. Some medicines are not safe during pregnancy.  Take a prenatal vitamin that contains at least 600 micrograms (mcg) of folic acid. Eating and drinking  Eat healthy meals that include: ? Fresh fruits and vegetables. ? Whole grains. ? Good sources of protein, such as meat, eggs, or tofu. ? Low-fat dairy products.  Avoid raw meat and unpasteurized juice, milk, and cheese.  You may need to take these actions to prevent or  treat trouble pooping: ? Drink enough fluids to keep your pee (urine) pale yellow. ? Eat foods that are high in fiber. These include beans, whole grains, and fresh fruits and vegetables. ? Limit foods that are high in fat and sugar. These include fried or sweet foods. Activity  Exercise only as told by your doctor. Most people can do their usual exercise during pregnancy. Try to exercise for 30 minutes at least 5 days a week.  Stop exercising if you have pain or cramps in your belly or lower back.  Do not exercise if it is too hot or too humid, or if you are in a place of great height (high altitude).  Avoid heavy lifting.  If you choose to, you may have sex unless your doctor tells you not to. Relieving pain and discomfort  Wear a good support bra if your breasts are sore.  Take warm water baths (sitz baths) to soothe pain or discomfort caused by hemorrhoids. Use hemorrhoid cream if your doctor approves.  Rest with your legs raised (elevated) if you have leg cramps or low back pain.  If you develop bulging veins in your legs: ? Wear support hose as told by your doctor. ? Raise your feet for 15 minutes, 3-4 times a day. ? Limit salt in your food. Safety  Wear your seat belt at all times when you are in a car.  Talk with your doctor if someone is hurting you or yelling  at you a lot. Lifestyle  Do not use hot tubs, steam rooms, or saunas.  Do not douche. Do not use tampons or scented sanitary pads.  Avoid cat litter boxes and soil used by cats. These carry germs that can harm your baby and can cause a loss of your baby by miscarriage or stillbirth.  Do not use herbal medicines, illegal drugs, or medicines that are not approved by your doctor. Do not drink alcohol.  Do not smoke or use any products that contain nicotine or tobacco. If you need help quitting, ask your doctor. General instructions  Keep all follow-up visits. This is important.  Ask your doctor about local  prenatal classes.  Ask your doctor about the right foods to eat or for help finding a counselor. Where to find more information  American Pregnancy Association: americanpregnancy.org  American College of Obstetricians and Gynecologists: www.acog.org  Office on Women's Health: womenshealth.gov/pregnancy Contact a doctor if:  You have a headache that does not go away when you take medicine.  You have changes in how you see, or you see spots in front of your eyes.  You have mild cramps, pressure, or pain in your lower belly.  You continue to feel like you may vomit (nauseous), you vomit, or you have watery poop (diarrhea).  You have bad-smelling fluid coming from your vagina.  You have pain when you pee or your pee smells bad.  You have very bad swelling of your face, hands, ankles, feet, or legs.  You have a fever. Get help right away if:  You are leaking fluid from your vagina.  You have spotting or bleeding from your vagina.  You have very bad belly cramping or pain.  You have trouble breathing.  You have chest pain.  You faint.  You have not felt your baby move for the time period told by your doctor.  You have new or increased pain, swelling, or redness in an arm or leg. Summary  The second trimester of pregnancy is from week 13 through week 27 (months 4 through 6).  Eat healthy meals.  Exercise as told by your doctor. Most people can do their usual exercise during pregnancy.  Do not use herbal medicines, illegal drugs, or medicines that are not approved by your doctor. Do not drink alcohol.  Call your doctor if you get sick or if you notice anything unusual about your pregnancy. This information is not intended to replace advice given to you by your health care provider. Make sure you discuss any questions you have with your health care provider. Document Revised: 09/24/2019 Document Reviewed: 07/31/2019 Elsevier Patient Education  2021 Elsevier  Inc.   Back Pain in Pregnancy Back pain during pregnancy is common. Back pain may be caused by several factors that are related to changes during your pregnancy. Follow these instructions at home: Managing pain, stiffness, and swelling  If directed, for sudden (acute) back pain, put ice on the painful area. ? Put ice in a plastic bag. ? Place a towel between your skin and the bag. ? Leave the ice on for 20 minutes, 2-3 times per day.  If directed, apply heat to the affected area before you exercise. Use the heat source that your health care provider recommends, such as a moist heat pack or a heating pad. ? Place a towel between your skin and the heat source. ? Leave the heat on for 20-30 minutes. ? Remove the heat if your skin turns bright red. This   This is especially important if you are unable to feel pain, heat, or cold. You may have a greater risk of getting burned.  If directed, massage the affected area.      Activity  Exercise as told by your health care provider. Gentle exercise is the best way to prevent or manage back pain.  Listen to your body when lifting. If lifting hurts, ask for help or bend your knees. This uses your leg muscles instead of your back muscles.  Squat down when picking up something from the floor. Do not bend over.  Only use bed rest for short periods as told by your health care provider. Bed rest should only be used for the most severe episodes of back pain. Standing, sitting, and lying down  Do not stand in one place for long periods of time.  Use good posture when sitting. Make sure your head rests over your shoulders and is not hanging forward. Use a pillow on your lower back if necessary.  Try sleeping on your side, preferably the left side, with a pregnancy support pillow or 1-2 regular pillows between your legs. ? If you have back pain after a night's rest, your bed may be too soft. ? A firm mattress may provide more support for your back during  pregnancy. General instructions  Do not wear high heels.  Eat a healthy diet. Try to gain weight within your health care provider's recommendations.  Use a maternity girdle, elastic sling, or back brace as told by your health care provider.  Take over-the-counter and prescription medicines only as told by your health care provider.  Work with a physical therapist or massage therapist to find ways to manage back pain. Acupuncture or massage therapy may be helpful.  Keep all follow-up visits as told by your health care provider. This is important. Contact a health care provider if:  Your back pain interferes with your daily activities.  You have increasing pain in other parts of your body. Get help right away if:  You develop numbness, tingling, weakness, or problems with the use of your arms or legs.  You develop severe back pain that is not controlled with medicine.  You have a change in bowel or bladder control.  You develop shortness of breath, dizziness, or you faint.  You develop nausea, vomiting, or sweating.  You have back pain that is a rhythmic, cramping pain similar to labor pains. Labor pain is usually 1-2 minutes apart, lasts for about 1 minute, and involves a bearing down feeling or pressure in your pelvis.  You have back pain and your water breaks or you have vaginal bleeding.  You have back pain or numbness that travels down your leg.  Your back pain developed after you fell.  You develop pain on one side of your back.  You see blood in your urine.  You develop skin blisters in the area of your back pain. Summary  Back pain may be caused by several factors that are related to changes during your pregnancy.  Follow instructions as told by your health care provider for managing pain, stiffness, and swelling.  Exercise as told by your health care provider. Gentle exercise is the best way to prevent or manage back pain.  Take over-the-counter and  prescription medicines only as told by your health care provider.  Keep all follow-up visits as told by your health care provider. This is important. This information is not intended to replace advice given to you by  your health care provider. Make sure you discuss any questions you have with your health care provider. Document Revised: 08/06/2018 Document Reviewed: 10/03/2017 Elsevier Patient Education  2021 Elsevier Inc.   Round Ligament Pain  The round ligament is a cord of muscle and tissue that helps support the uterus. It can become a source of pain during pregnancy if it becomes stretched or twisted as the baby grows. The pain usually begins in the second trimester (13-28 weeks) of pregnancy, and it can come and go until the baby is delivered. It is not a serious problem, and it does not cause harm to the baby. Round ligament pain is usually a short, sharp, and pinching pain, but it can also be a dull, lingering, and aching pain. The pain is felt in the lower side of the abdomen or in the groin. It usually starts deep in the groin and moves up to the outside of the hip area. The pain may occur when you:  Suddenly change position, such as quickly going from a sitting to standing position.  Roll over in bed.  Cough or sneeze.  Do physical activity. Follow these instructions at home:  Watch your condition for any changes.  When the pain starts, relax. Then try any of these methods to help with the pain: ? Sitting down. ? Flexing your knees up to your abdomen. ? Lying on your side with one pillow under your abdomen and another pillow between your legs. ? Sitting in a warm bath for 15-20 minutes or until the pain goes away.  Take over-the-counter and prescription medicines only as told by your health care provider.  Move slowly when you sit down or stand up.  Avoid long walks if they cause pain.  Stop or reduce your physical activities if they cause pain.  Keep all follow-up  visits as told by your health care provider. This is important.   Contact a health care provider if:  Your pain does not go away with treatment.  You feel pain in your back that you did not have before.  Your medicine is not helping. Get help right away if:  You have a fever or chills.  You develop uterine contractions.  You have vaginal bleeding.  You have nausea or vomiting.  You have diarrhea.  You have pain when you urinate. Summary  Round ligament pain is felt in the lower abdomen or groin. It is usually a short, sharp, and pinching pain. It can also be a dull, lingering, and aching pain.  This pain usually begins in the second trimester (13-28 weeks). It occurs because the uterus is stretching with the growing baby, and it is not harmful to the baby.  You may notice the pain when you suddenly change position, when you cough or sneeze, or during physical activity.  Relaxing, flexing your knees to your abdomen, lying on one side, or taking a warm bath may help to get rid of the pain.  Get help from your health care provider if the pain does not go away or if you have vaginal bleeding, nausea, vomiting, diarrhea, or painful urination. This information is not intended to replace advice given to you by your health care provider. Make sure you discuss any questions you have with your health care provider. Document Revised: 10/03/2017 Document Reviewed: 10/03/2017 Elsevier Patient Education  2021 [employer].

## 2020-07-09 NOTE — Progress Notes (Signed)
ROB-c/o slight cramping here and there. Belly and back itchy.  Flu vaccine 11/2019.

## 2020-07-24 DIAGNOSIS — O368121 Decreased fetal movements, second trimester, fetus 1: Secondary | ICD-10-CM | POA: Diagnosis not present

## 2020-08-04 ENCOUNTER — Other Ambulatory Visit: Payer: Self-pay

## 2020-08-04 ENCOUNTER — Ambulatory Visit
Admission: RE | Admit: 2020-08-04 | Discharge: 2020-08-04 | Disposition: A | Discharge status: Home / Self Care | Payer: Medicaid Other | Source: Ambulatory Visit | Attending: Certified Nurse Midwife | Admitting: Certified Nurse Midwife

## 2020-08-04 DIAGNOSIS — O321XX0 Maternal care for breech presentation, not applicable or unspecified: Secondary | ICD-10-CM | POA: Diagnosis not present

## 2020-08-04 DIAGNOSIS — Z3492 Encounter for supervision of normal pregnancy, unspecified, second trimester: Secondary | ICD-10-CM | POA: Insufficient documentation

## 2020-08-04 DIAGNOSIS — Z3689 Encounter for other specified antenatal screening: Secondary | ICD-10-CM | POA: Insufficient documentation

## 2020-08-04 DIAGNOSIS — Z3A20 20 weeks gestation of pregnancy: Secondary | ICD-10-CM | POA: Diagnosis not present

## 2020-08-09 ENCOUNTER — Encounter: Payer: Medicaid Other | Admitting: Certified Nurse Midwife

## 2020-08-18 ENCOUNTER — Ambulatory Visit (INDEPENDENT_AMBULATORY_CARE_PROVIDER_SITE_OTHER): Payer: Medicaid Other | Admitting: Certified Nurse Midwife

## 2020-08-18 ENCOUNTER — Encounter: Payer: Medicaid Other | Admitting: Certified Nurse Midwife

## 2020-08-18 ENCOUNTER — Other Ambulatory Visit: Payer: Self-pay

## 2020-08-18 VITALS — BP 103/68 | HR 90 | Wt 125.4 lb

## 2020-08-18 DIAGNOSIS — Z3A22 22 weeks gestation of pregnancy: Secondary | ICD-10-CM

## 2020-08-18 DIAGNOSIS — Z3402 Encounter for supervision of normal first pregnancy, second trimester: Secondary | ICD-10-CM

## 2020-08-18 LAB — POCT URINALYSIS DIPSTICK OB
Bilirubin, UA: NEGATIVE
Blood, UA: NEGATIVE
Glucose, UA: NEGATIVE
Ketones, UA: NEGATIVE
Leukocytes, UA: NEGATIVE
Nitrite, UA: NEGATIVE
POC,PROTEIN,UA: NEGATIVE
Spec Grav, UA: 1.01 (ref 1.010–1.025)
Urobilinogen, UA: 0.2 E.U./dL
pH, UA: 7 (ref 5.0–8.0)

## 2020-08-18 MED ORDER — VALACYCLOVIR HCL 1 G PO TABS: 1000.0000 mg | ORAL_TABLET | Freq: Every day | ORAL | 3 refills | Status: AC

## 2020-08-18 NOTE — Patient Instructions (Signed)

## 2020-08-18 NOTE — Progress Notes (Signed)
ROB: Doing well, no new concerns. 

## 2020-08-18 NOTE — Progress Notes (Signed)
ROB doing well. Feels fetal movement. Discussed round ligament pain and self help measures. Reviewed anatomy u/s. All questions answered. Pt request valtrex , thinks she may be getting an outbreak, orders placed. Follow up 3 wks with Marcelino Duster for ROB.   Doreene Burke, CNM

## 2020-09-05 DIAGNOSIS — Z3A25 25 weeks gestation of pregnancy: Secondary | ICD-10-CM | POA: Diagnosis not present

## 2020-09-05 DIAGNOSIS — O99282 Endocrine, nutritional and metabolic diseases complicating pregnancy, second trimester: Secondary | ICD-10-CM | POA: Diagnosis not present

## 2020-09-05 DIAGNOSIS — Z0371 Encounter for suspected problem with amniotic cavity and membrane ruled out: Secondary | ICD-10-CM | POA: Diagnosis not present

## 2020-09-05 DIAGNOSIS — E869 Volume depletion, unspecified: Secondary | ICD-10-CM | POA: Diagnosis not present

## 2020-09-06 ENCOUNTER — Telehealth: Payer: Self-pay

## 2020-09-06 NOTE — Telephone Encounter (Signed)
Transition Care Management Unsuccessful Follow-up Telephone Call  Date of discharge and from where:  09/05/20 from [facility]  Attempts:  1st Attempt  Reason for unsuccessful TCM follow-up call:  Unable to leave message

## 2020-09-07 NOTE — Telephone Encounter (Signed)
Transition Care Management Unsuccessful Follow-up Telephone Call  Date of discharge and from where:  09/05/2020 from [facility]  Attempts:  2nd Attempt  Reason for unsuccessful TCM follow-up call:  Unable to leave message

## 2020-09-08 NOTE — Telephone Encounter (Signed)
Transition Care Management Unsuccessful Follow-up Telephone Call  Date of discharge and from where:  09/05/2020 from [facility]  Attempts:  3rd Attempt  Reason for unsuccessful TCM follow-up call:  Unable to reach patient

## 2020-09-09 ENCOUNTER — Ambulatory Visit (INDEPENDENT_AMBULATORY_CARE_PROVIDER_SITE_OTHER): Payer: Medicaid Other | Admitting: Certified Nurse Midwife

## 2020-09-09 ENCOUNTER — Other Ambulatory Visit: Payer: Self-pay

## 2020-09-09 ENCOUNTER — Encounter: Payer: Self-pay | Admitting: Certified Nurse Midwife

## 2020-09-09 VITALS — BP 120/68 | HR 92 | Wt 130.1 lb

## 2020-09-09 DIAGNOSIS — Z3402 Encounter for supervision of normal first pregnancy, second trimester: Secondary | ICD-10-CM

## 2020-09-09 DIAGNOSIS — Z3A25 25 weeks gestation of pregnancy: Secondary | ICD-10-CM

## 2020-09-09 LAB — POCT URINALYSIS DIPSTICK OB
Bilirubin, UA: NEGATIVE
Blood, UA: NEGATIVE
Glucose, UA: NEGATIVE
Ketones, UA: NEGATIVE
Leukocytes, UA: NEGATIVE
Nitrite, UA: NEGATIVE
POC,PROTEIN,UA: NEGATIVE
Spec Grav, UA: 1.01 (ref 1.010–1.025)
Urobilinogen, UA: 0.2 E.U./dL
pH, UA: 8 (ref 5.0–8.0)

## 2020-09-09 NOTE — Patient Instructions (Addendum)
WHAT OB PATIENTS CAN EXPECT   Confirmation of pregnancy and ultrasound ordered if medically indicated-[redacted] weeks gestation  New OB (NOB) intake with nurse and New OB (NOB) labs- [redacted] weeks gestation  New OB (NOB) physical examination with provider- 11/[redacted] weeks gestation  Flu vaccine-[redacted] weeks gestation  Anatomy scan-[redacted] weeks gestation  Glucose tolerance test, blood work to test for anemia, T-dap vaccine-[redacted] weeks gestation  Vaginal swabs/cultures-STD/Group B strep-[redacted] weeks gestation  Appointments every 4 weeks until 28 weeks  Every 2 weeks from 28 weeks until 36 weeks  Weekly visits from 36 weeks until delivery  Second Trimester of Pregnancy  The second trimester of pregnancy is from week 13 through week 27. This is also called months 4 through 6 of pregnancy. This is often the time when you feel your best. During the second trimester:  Morning sickness is less or has stopped.  You may have more energy.  You may feel hungry more often. At this time, your unborn baby (fetus) is growing very fast. At the end of the sixth month, the unborn baby may be up to 12 inches long and weigh about 1 pounds. You will likely start to feel the baby move between 16 and 20 weeks of pregnancy. Body changes during your second trimester Your body continues to go through many changes during this time. The changes vary and generally return to normal after the baby is born. Physical changes  You will gain more weight.  You may start to get stretch marks on your hips, belly (abdomen), and breasts.  Your breasts will grow and may hurt.  Dark spots or blotches may develop on your face.  A dark line from your belly button to the pubic area (linea nigra) may appear.  You may have changes in your hair. Health changes  You may have headaches.  You may have heartburn.  You may have trouble pooping (constipation).  You may have hemorrhoids or swollen, bulging veins (varicose veins).  Your gums may  bleed.  You may pee (urinate) more often.  You may have back pain. Follow these instructions at home: Medicines  Take over-the-counter and prescription medicines only as told by your doctor. Some medicines are not safe during pregnancy.  Take a prenatal vitamin that contains at least 600 micrograms (mcg) of folic acid. Eating and drinking  Eat healthy meals that include: ? Fresh fruits and vegetables. ? Whole grains. ? Good sources of protein, such as meat, eggs, or tofu. ? Low-fat dairy products.  Avoid raw meat and unpasteurized juice, milk, and cheese.  You may need to take these actions to prevent or treat trouble pooping: ? Drink enough fluids to keep your pee (urine) pale yellow. ? Eat foods that are high in fiber. These include beans, whole grains, and fresh fruits and vegetables. ? Limit foods that are high in fat and sugar. These include fried or sweet foods. Activity  Exercise only as told by your doctor. Most people can do their usual exercise during pregnancy. Try to exercise for 30 minutes at least 5 days a week.  Stop exercising if you have pain or cramps in your belly or lower back.  Do not exercise if it is too hot or too humid, or if you are in a place of great height (high altitude).  Avoid heavy lifting.  If you choose to, you may have sex unless your doctor tells you not to. Relieving pain and discomfort  Wear a good support bra if your breasts   your breasts are sore.  Take warm water baths (sitz baths) to soothe pain or discomfort caused by hemorrhoids. Use hemorrhoid cream if your doctor approves.  Rest with your legs raised (elevated) if you have leg cramps or low back pain.  If you develop bulging veins in your legs: ? Wear support hose as told by your doctor. ? Raise your feet for 15 minutes, 3-4 times a day. ? Limit salt in your food. Safety  Wear your seat belt at all times when you are in a car.  Talk with your doctor if someone is hurting you or  yelling at you a lot. Lifestyle  Do not use hot tubs, steam rooms, or saunas.  Do not douche. Do not use tampons or scented sanitary pads.  Avoid cat litter boxes and soil used by cats. These carry germs that can harm your baby and can cause a loss of your baby by miscarriage or stillbirth.  Do not use herbal medicines, illegal drugs, or medicines that are not approved by your doctor. Do not drink alcohol.  Do not smoke or use any products that contain nicotine or tobacco. If you need help quitting, ask your doctor. General instructions  Keep all follow-up visits. This is important.  Ask your doctor about local prenatal classes.  Ask your doctor about the right foods to eat or for help finding a counselor. Where to find more information  American Pregnancy Association: americanpregnancy.org  [employer]: www.acog.org  Office on [employer] [facility]: [employer]nz Contact a doctor if:  You have a headache that does not go away when you take medicine.  You have changes in how you see, or you see spots in front of your eyes.  You have mild cramps, pressure, or pain in your lower belly.  You continue to feel like you may vomit (nauseous), you vomit, or you have watery poop (diarrhea).  You have bad-smelling fluid coming from your vagina.  You have pain when you pee or your pee smells bad.  You have very bad swelling of your face, hands, ankles, feet, or legs.  You have a fever. Get help right away if:  You are leaking fluid from your vagina.  You have spotting or bleeding from your vagina.  You have very bad belly cramping or pain.  You have trouble breathing.  You have chest pain.  You faint.  You have not felt your baby move for the time period told by your doctor.  You have new or increased pain, swelling, or redness in an arm or leg. Summary  The second trimester of pregnancy is from week 13 through week 27  (months 4 through 6).  Eat healthy meals.  Exercise as told by your doctor. Most people can do their usual exercise during pregnancy.  Do not use herbal medicines, illegal drugs, or medicines that are not approved by your doctor. Do not drink alcohol.  Call your doctor if you get sick or if you notice anything unusual about your pregnancy. This information is not intended to replace advice given to you by your health care provider. Make sure you discuss any questions you have with your health care provider. Document Revised: 09/24/2019 Document Reviewed: 07/31/2019 Elsevier Patient Education  2021 [employer].

## 2020-09-09 NOTE — Progress Notes (Signed)
ROB-Doing well overall, report pea sized lump to left underarm-round, soft mobile; will continue to monitor and schedule ultrasound if needed. Anticipatory guidance regarding course of prenatal care. Reviewed red flag symptoms and when to call. RTC x 3-4 weeks for 28 week labs, TDaP, and ROB or sooner if needed.

## 2020-09-09 NOTE — Progress Notes (Signed)
ROB: she was spotting earlier in the week, it has now resolved. No new concerns today.

## 2020-10-01 ENCOUNTER — Other Ambulatory Visit: Payer: Self-pay

## 2020-10-01 DIAGNOSIS — Z3403 Encounter for supervision of normal first pregnancy, third trimester: Secondary | ICD-10-CM

## 2020-10-01 DIAGNOSIS — Z3A28 28 weeks gestation of pregnancy: Secondary | ICD-10-CM

## 2020-10-04 ENCOUNTER — Other Ambulatory Visit: Payer: Medicaid Other

## 2020-10-04 ENCOUNTER — Other Ambulatory Visit: Payer: Self-pay

## 2020-10-04 ENCOUNTER — Ambulatory Visit (INDEPENDENT_AMBULATORY_CARE_PROVIDER_SITE_OTHER): Payer: Medicaid Other | Admitting: Certified Nurse Midwife

## 2020-10-04 ENCOUNTER — Encounter: Payer: Self-pay | Admitting: Certified Nurse Midwife

## 2020-10-04 VITALS — BP 112/73 | HR 108 | Wt 135.0 lb

## 2020-10-04 DIAGNOSIS — Z3403 Encounter for supervision of normal first pregnancy, third trimester: Secondary | ICD-10-CM | POA: Diagnosis not present

## 2020-10-04 DIAGNOSIS — Z3A28 28 weeks gestation of pregnancy: Secondary | ICD-10-CM | POA: Diagnosis not present

## 2020-10-04 DIAGNOSIS — Z23 Encounter for immunization: Secondary | ICD-10-CM | POA: Diagnosis not present

## 2020-10-04 DIAGNOSIS — Z3A29 29 weeks gestation of pregnancy: Secondary | ICD-10-CM | POA: Diagnosis not present

## 2020-10-04 LAB — POCT URINALYSIS DIPSTICK OB
Bilirubin, UA: NEGATIVE
Blood, UA: NEGATIVE
Glucose, UA: NEGATIVE
Ketones, UA: NEGATIVE
Nitrite, UA: NEGATIVE
Spec Grav, UA: 1.01 (ref 1.010–1.025)
Urobilinogen, UA: 0.2 E.U./dL
pH, UA: 7.5 (ref 5.0–8.0)

## 2020-10-04 NOTE — Progress Notes (Signed)
ROB doing well. Feels good movement. 28 wk labs today: Glucose screen/RPR/CBC. Tdapdone, Blood transfusion consent completed, all questions answered. Ready set baby reviewed, see check list for topics covered. Sample birth plan given, will follow up in upcoming visits. Discussed birth control after delivery, information pamphlet given. She states she has had some floaters in her vision. Reviewed PRE signs and symptoms. She denies any other symptoms. Reassurance given. She is interested in water birth. Discussed with pt, encouraged her to sign up for class. She verbalizes and agrees. She is to bring in her certificate once completed.   Follow up 2 wk with Marcelino Duster for ROB or sooner if needed.    Doreene Burke, CNM

## 2020-10-04 NOTE — Progress Notes (Signed)
ROB: She is doing well. She has no concerns today.  

## 2020-10-05 ENCOUNTER — Other Ambulatory Visit: Payer: Self-pay | Admitting: Certified Nurse Midwife

## 2020-10-05 DIAGNOSIS — O99013 Anemia complicating pregnancy, third trimester: Secondary | ICD-10-CM

## 2020-10-05 DIAGNOSIS — O24414 Gestational diabetes mellitus in pregnancy, insulin controlled: Secondary | ICD-10-CM

## 2020-10-05 DIAGNOSIS — O24410 Gestational diabetes mellitus in pregnancy, diet controlled: Secondary | ICD-10-CM

## 2020-10-05 LAB — GLUCOSE, 1 HOUR GESTATIONAL: Gestational Diabetes Screen: 191 mg/dL — ABNORMAL HIGH (ref 65–139)

## 2020-10-05 LAB — CBC
Hematocrit: 25.1 % — ABNORMAL LOW (ref 34.0–46.6)
Hemoglobin: 7.2 g/dL — ABNORMAL LOW (ref 11.1–15.9)
MCH: 20.1 pg — ABNORMAL LOW (ref 26.6–33.0)
MCHC: 28.7 g/dL — ABNORMAL LOW (ref 31.5–35.7)
MCV: 70 fL — ABNORMAL LOW (ref 79–97)
Platelets: 258 10*3/uL (ref 150–450)
RBC: 3.58 x10E6/uL — ABNORMAL LOW (ref 3.77–5.28)
RDW: 17.1 % — ABNORMAL HIGH (ref 11.7–15.4)
WBC: 11 10*3/uL — ABNORMAL HIGH (ref 3.4–10.8)

## 2020-10-05 LAB — RPR: RPR Ser Ql: NONREACTIVE

## 2020-10-05 MED ORDER — FUSION PLUS PO CAPS: 1.0000 | ORAL_CAPSULE | Freq: Every day | ORAL | 9 refills | Status: DC

## 2020-10-08 ENCOUNTER — Encounter: Payer: Self-pay | Admitting: *Deleted

## 2020-10-08 ENCOUNTER — Encounter: Payer: Medicaid Other | Attending: Certified Nurse Midwife | Admitting: *Deleted

## 2020-10-08 ENCOUNTER — Other Ambulatory Visit: Payer: Self-pay

## 2020-10-08 ENCOUNTER — Telehealth: Payer: Self-pay | Admitting: Certified Nurse Midwife

## 2020-10-08 VITALS — BP 96/56 | Ht 61.0 in | Wt 137.2 lb

## 2020-10-08 DIAGNOSIS — O24419 Gestational diabetes mellitus in pregnancy, unspecified control: Secondary | ICD-10-CM | POA: Insufficient documentation

## 2020-10-08 DIAGNOSIS — O24410 Gestational diabetes mellitus in pregnancy, diet controlled: Secondary | ICD-10-CM

## 2020-10-08 DIAGNOSIS — Z3A30 30 weeks gestation of pregnancy: Secondary | ICD-10-CM | POA: Insufficient documentation

## 2020-10-08 NOTE — Telephone Encounter (Signed)
Pt went to diabetes class today - they asked her to reach out to you to request Aqucheck guide and soft clicks. Please Advise.   Pharm: [facility] on [address]

## 2020-10-08 NOTE — Patient Instructions (Signed)
Read booklet on Gestational Diabetes Follow Gestational Meal Planning Guidelines Don't skip meals - eat at least 1 protein and 1 carbohydrate serving Limit desserts/sweets Avoid cold cereal for breakfast Avoid sugar- sweetened drinks (juice, soda, tea, lemonade) Complete a 3 Day Food Record and bring to next appointment Check blood sugars 4 x day - before breakfast and 2 hrs after every meal and record  Bring blood sugar log to all appointments Call MD for prescription for meter strips and lancets Strips   Accu-Chek Guide  Lancets   Accu-Chek Softclix Purchase urine ketone strips if instructed by MD and check urine ketones every am:  If + increase bedtime snack to 1 protein and 2 carbohydrate servings Walk 20-30 minutes at least 5 x week if permitted by MD

## 2020-10-08 NOTE — Progress Notes (Signed)
Diabetes Self-Management Education  Visit Type: First/Initial  Appt. Start Time: 1340 Appt. End Time: 1515  10/08/2020  [name], identified by name and date of birth, is a 25 y.o. female with a diagnosis of Diabetes: Gestational Diabetes.   ASSESSMENT  Blood pressure (!) 96/56, height 5\' 1"  (1.549 m), weight 137 lb 3.2 oz (62.2 kg), last menstrual period 03/11/2020, estimated date of delivery 12/19/2020 Body mass index is 25.92 kg/m.   Diabetes Self-Management Education - 10/08/20 1614       Visit Information   Visit Type First/Initial      Initial Visit   Diabetes Type Gestational Diabetes    Are you currently following a meal plan? No    Are you taking your medications as prescribed? Yes    Date Diagnosed "a few days ago"      Health Coping   How would you rate your overall health? Fair      Psychosocial Assessment   Patient Belief/Attitude about Diabetes Other (comment)   "stressed and worried"   Self-care barriers None    Self-management support Doctor's office;Family    Patient Concerns Nutrition/Meal planning;Glycemic Control;Monitoring    Special Needs None    Preferred Learning Style Visual    Learning Readiness Ready    How often do you need to have someone help you when you read instructions, pamphlets, or other written materials from your doctor or pharmacy? 1 - Never    What is the last grade level you completed in school? some college      Pre-Education Assessment   Patient understands the diabetes disease and treatment process. Needs Instruction    Patient understands incorporating nutritional management into lifestyle. Needs Instruction    Patient undertands incorporating physical activity into lifestyle. Needs Instruction    Patient understands using medications safely. Needs Instruction    Patient understands monitoring blood glucose, interpreting and using results Needs Instruction    Patient understands prevention, detection, and treatment  of acute complications. Needs Instruction    Patient understands prevention, detection, and treatment of chronic complications. Needs Instruction    Patient understands how to develop strategies to address psychosocial issues. Needs Instruction    Patient understands how to develop strategies to promote health/change behavior. Needs Instruction      Complications   How often do you check your blood sugar? 0 times/day (not testing)   Provided Accu-Chek Guide Me meter and instructed on use. BG upon return demonstration was 77 mg/dL at 12/08/20 pm - 6 1/2 hrs pp.   Have you had a dilated eye exam in the past 12 months? No    Have you had a dental exam in the past 12 months? Yes    Are you checking your feet? No      Dietary Intake   Breakfast Works 7 pm to 7 am (30 hrs week) skips or eats granola bar; cereal and milk    Snack (morning) 2-3 snacks/day of fruit (watermelon, mango, strawberries)    Lunch toasted 1:51 or ham sandwich    Dinner baked or seered chicken, beef, occasional fish; potatoes, rice, pasta, green beans, broccoli, salad with lettuce, eggs, cheese, cuccumbers, occasional tomato    Beverage(s) water, juice, 1/2 tea and 1/2 lemonade, recently stopped soda      Exercise   Exercise Type Light (walking / raking leaves)    How many days per week to you exercise? 3    How many minutes per day do you exercise? 30  Total minutes per week of exercise 90      Patient Education   Previous Diabetes Education No    Disease state  Definition of diabetes, type 1 and 2, and the diagnosis of diabetes;Factors that contribute to the development of diabetes    Nutrition management  Role of diet in the treatment of diabetes and the relationship between the three main macronutrients and blood glucose level;Food label reading, portion sizes and measuring food.;Reviewed blood glucose goals for pre and post meals and how to evaluate the patients' food intake on their blood glucose level.    Physical  activity and exercise  Role of exercise on diabetes management, blood pressure control and cardiac health.    Medications Other (comment)   Limited use of oral medications during pregnancy and potential for insulin.   Monitoring Taught/evaluated SMBG meter.;Purpose and frequency of SMBG.;Taught/discussed recording of test results and interpretation of SMBG.;Identified appropriate SMBG and/or A1C goals.;Ketone testing, when, how.    Chronic complications Relationship between chronic complications and blood glucose control    Psychosocial adjustment Identified and addressed patients feelings and concerns about diabetes    Preconception care Pregnancy and GDM  Role of pre-pregnancy blood glucose control on the development of the fetus;Reviewed with patient blood glucose goals with pregnancy;Role of family planning for patients with diabetes      Individualized Goals (developed by patient)   Reducing Risk Other (comment)   improve blood sugars, prevent diabetes complications     Outcomes   Expected Outcomes Demonstrated interest in learning. Expect positive outcomes             Individualized Plan for Diabetes Self-Management Training:   Learning Objective:  Patient will have a greater understanding of diabetes self-management. Patient education plan is to attend individual and/or group sessions per assessed needs and concerns.   Plan:   Patient Instructions  Read booklet on Gestational Diabetes Follow Gestational Meal Planning Guidelines Don't skip meals - eat at least 1 protein and 1 carbohydrate serving Limit desserts/sweets Avoid cold cereal for breakfast Avoid sugar- sweetened drinks (juice, soda, tea, lemonade) Complete a 3 Day Food Record and bring to next appointment Check blood sugars 4 x day - before breakfast and 2 hrs after every meal and record  Bring blood sugar log to all appointments Call MD for prescription for meter strips and lancets Strips   Accu-Chek  Guide  Lancets   Accu-Chek Softclix Purchase urine ketone strips if instructed by MD and check urine ketones every am:  If + increase bedtime snack to 1 protein and 2 carbohydrate servings Walk 20-30 minutes at least 5 x week if permitted by MD  Expected Outcomes:  Demonstrated interest in learning. Expect positive outcomes  Education material provided:  Gestational Booklet Gestational Meal Planning Guidelines Simple Meal Plan Viewed Gestational Diabetes Video Meter = Accu-Chek Guide Me 3 Day Food Record Goals for a Healthy Pregnancy   If problems or questions, patient to contact team via:   Sharion Settler, RN, CCM, CDCES [phone]  Future DSME appointment:  October 15, 2020 with the dietitian

## 2020-10-11 ENCOUNTER — Other Ambulatory Visit: Payer: Self-pay | Admitting: Surgical

## 2020-10-11 DIAGNOSIS — O24419 Gestational diabetes mellitus in pregnancy, unspecified control: Secondary | ICD-10-CM

## 2020-10-11 MED ORDER — BLOOD GLUCOSE MONITOR KIT
PACK | 0 refills | Status: AC
Start: 2020-10-11 — End: ?

## 2020-10-12 ENCOUNTER — Other Ambulatory Visit: Payer: Self-pay | Admitting: Surgical

## 2020-10-12 DIAGNOSIS — O24419 Gestational diabetes mellitus in pregnancy, unspecified control: Secondary | ICD-10-CM

## 2020-10-12 MED ORDER — ACCU-CHEK SOFTCLIX LANCETS MISC: 12 refills | Status: AC

## 2020-10-12 MED ORDER — GLUCOSE BLOOD VI STRP
ORAL_STRIP | 12 refills | Status: AC
Start: 2020-10-12 — End: ?

## 2020-10-13 DIAGNOSIS — Z3A30 30 weeks gestation of pregnancy: Secondary | ICD-10-CM | POA: Diagnosis not present

## 2020-10-13 DIAGNOSIS — R102 Pelvic and perineal pain: Secondary | ICD-10-CM | POA: Diagnosis not present

## 2020-10-13 DIAGNOSIS — O99810 Abnormal glucose complicating pregnancy: Secondary | ICD-10-CM | POA: Diagnosis not present

## 2020-10-13 DIAGNOSIS — O2693 Pregnancy related conditions, unspecified, third trimester: Secondary | ICD-10-CM | POA: Diagnosis not present

## 2020-10-13 DIAGNOSIS — R109 Unspecified abdominal pain: Secondary | ICD-10-CM | POA: Diagnosis not present

## 2020-10-13 DIAGNOSIS — O99891 Other specified diseases and conditions complicating pregnancy: Secondary | ICD-10-CM | POA: Diagnosis not present

## 2020-10-13 DIAGNOSIS — O24419 Gestational diabetes mellitus in pregnancy, unspecified control: Secondary | ICD-10-CM | POA: Diagnosis not present

## 2020-10-15 ENCOUNTER — Ambulatory Visit: Payer: Medicaid Other | Admitting: Dietician

## 2020-10-18 ENCOUNTER — Encounter: Payer: Medicaid Other | Admitting: Certified Nurse Midwife

## 2020-10-20 DIAGNOSIS — Z87828 Personal history of other (healed) physical injury and trauma: Secondary | ICD-10-CM | POA: Insufficient documentation

## 2020-10-20 DIAGNOSIS — Z87898 Personal history of other specified conditions: Secondary | ICD-10-CM | POA: Insufficient documentation

## 2020-10-20 DIAGNOSIS — Z3490 Encounter for supervision of normal pregnancy, unspecified, unspecified trimester: Secondary | ICD-10-CM | POA: Diagnosis not present

## 2020-10-20 DIAGNOSIS — D649 Anemia, unspecified: Secondary | ICD-10-CM | POA: Diagnosis not present

## 2020-10-20 DIAGNOSIS — R2230 Localized swelling, mass and lump, unspecified upper limb: Secondary | ICD-10-CM | POA: Insufficient documentation

## 2020-10-20 DIAGNOSIS — R6889 Other general symptoms and signs: Secondary | ICD-10-CM | POA: Diagnosis not present

## 2020-10-20 DIAGNOSIS — R2232 Localized swelling, mass and lump, left upper limb: Secondary | ICD-10-CM | POA: Diagnosis not present

## 2020-10-20 DIAGNOSIS — O24419 Gestational diabetes mellitus in pregnancy, unspecified control: Secondary | ICD-10-CM | POA: Diagnosis not present

## 2020-10-20 DIAGNOSIS — Z8659 Personal history of other mental and behavioral disorders: Secondary | ICD-10-CM | POA: Insufficient documentation

## 2020-10-20 DIAGNOSIS — Z3403 Encounter for supervision of normal first pregnancy, third trimester: Secondary | ICD-10-CM | POA: Diagnosis not present

## 2020-10-22 DIAGNOSIS — O99810 Abnormal glucose complicating pregnancy: Secondary | ICD-10-CM | POA: Diagnosis not present

## 2020-10-22 DIAGNOSIS — D649 Anemia, unspecified: Secondary | ICD-10-CM | POA: Diagnosis not present

## 2020-10-22 DIAGNOSIS — Z3403 Encounter for supervision of normal first pregnancy, third trimester: Secondary | ICD-10-CM | POA: Diagnosis not present

## 2020-10-31 DIAGNOSIS — O99013 Anemia complicating pregnancy, third trimester: Secondary | ICD-10-CM | POA: Diagnosis not present

## 2020-10-31 DIAGNOSIS — Z3A33 33 weeks gestation of pregnancy: Secondary | ICD-10-CM | POA: Diagnosis not present

## 2020-10-31 DIAGNOSIS — D509 Iron deficiency anemia, unspecified: Secondary | ICD-10-CM | POA: Diagnosis not present

## 2020-10-31 DIAGNOSIS — J45909 Unspecified asthma, uncomplicated: Secondary | ICD-10-CM | POA: Diagnosis not present

## 2020-10-31 DIAGNOSIS — Z79899 Other long term (current) drug therapy: Secondary | ICD-10-CM | POA: Diagnosis not present

## 2020-10-31 DIAGNOSIS — O99513 Diseases of the respiratory system complicating pregnancy, third trimester: Secondary | ICD-10-CM | POA: Diagnosis not present

## 2020-11-03 DIAGNOSIS — O0993 Supervision of high risk pregnancy, unspecified, third trimester: Secondary | ICD-10-CM | POA: Diagnosis not present

## 2020-11-03 DIAGNOSIS — Z87898 Personal history of other specified conditions: Secondary | ICD-10-CM | POA: Diagnosis not present

## 2020-11-08 ENCOUNTER — Telehealth: Payer: Self-pay

## 2020-11-08 DIAGNOSIS — O24419 Gestational diabetes mellitus in pregnancy, unspecified control: Secondary | ICD-10-CM | POA: Diagnosis not present

## 2020-11-08 DIAGNOSIS — Z3A34 34 weeks gestation of pregnancy: Secondary | ICD-10-CM | POA: Diagnosis not present

## 2020-11-08 DIAGNOSIS — Z713 Dietary counseling and surveillance: Secondary | ICD-10-CM | POA: Diagnosis not present

## 2020-11-08 NOTE — Telephone Encounter (Signed)
-----   Message from Doreene Burke, [place] sent at 11/06/2020 11:56 AM EDT ----- Angela Hicks,   Can we call this pt and see if she is tranfering her care to [facility]. She has not been back into [place] and I see notes from [facility]. Please let me know.   Barnabas Lister

## 2020-11-08 NOTE — Telephone Encounter (Signed)
LVM with patient instructing her to call back to let us know if she is receiving care from [facility]. Will follow up with patient.

## 2020-11-11 ENCOUNTER — Encounter: Payer: Self-pay | Admitting: Dietician

## 2020-11-11 NOTE — Progress Notes (Signed)
Have not heard back from patient to reschedule her missed appointment from 10/15/20. Sent notification to referring provider.

## 2020-11-17 DIAGNOSIS — Z3A35 35 weeks gestation of pregnancy: Secondary | ICD-10-CM | POA: Diagnosis not present

## 2020-11-17 DIAGNOSIS — O9229 Other disorders of breast associated with pregnancy and the puerperium: Secondary | ICD-10-CM | POA: Diagnosis not present

## 2020-11-17 DIAGNOSIS — O0993 Supervision of high risk pregnancy, unspecified, third trimester: Secondary | ICD-10-CM | POA: Diagnosis not present

## 2020-11-17 DIAGNOSIS — R2232 Localized swelling, mass and lump, left upper limb: Secondary | ICD-10-CM | POA: Diagnosis not present

## 2020-11-22 DIAGNOSIS — Z3A36 36 weeks gestation of pregnancy: Secondary | ICD-10-CM | POA: Diagnosis not present

## 2020-11-22 DIAGNOSIS — O24410 Gestational diabetes mellitus in pregnancy, diet controlled: Secondary | ICD-10-CM | POA: Diagnosis not present

## 2020-11-22 DIAGNOSIS — Z3689 Encounter for other specified antenatal screening: Secondary | ICD-10-CM | POA: Diagnosis not present

## 2020-11-23 DIAGNOSIS — Z0000 Encounter for general adult medical examination without abnormal findings: Secondary | ICD-10-CM | POA: Diagnosis not present

## 2020-11-25 DIAGNOSIS — O99013 Anemia complicating pregnancy, third trimester: Secondary | ICD-10-CM | POA: Diagnosis not present

## 2020-11-25 DIAGNOSIS — Z87898 Personal history of other specified conditions: Secondary | ICD-10-CM | POA: Diagnosis not present

## 2020-12-01 DIAGNOSIS — O99344 Other mental disorders complicating childbirth: Secondary | ICD-10-CM | POA: Diagnosis not present

## 2020-12-01 DIAGNOSIS — D509 Iron deficiency anemia, unspecified: Secondary | ICD-10-CM | POA: Diagnosis not present

## 2020-12-01 DIAGNOSIS — O9902 Anemia complicating childbirth: Secondary | ICD-10-CM | POA: Diagnosis not present

## 2020-12-01 DIAGNOSIS — F32A Depression, unspecified: Secondary | ICD-10-CM | POA: Diagnosis not present

## 2020-12-01 DIAGNOSIS — Z3A37 37 weeks gestation of pregnancy: Secondary | ICD-10-CM | POA: Diagnosis not present

## 2020-12-01 DIAGNOSIS — O4202 Full-term premature rupture of membranes, onset of labor within 24 hours of rupture: Secondary | ICD-10-CM | POA: Diagnosis not present

## 2020-12-01 DIAGNOSIS — B009 Herpesviral infection, unspecified: Secondary | ICD-10-CM | POA: Diagnosis not present

## 2020-12-01 DIAGNOSIS — Z20822 Contact with and (suspected) exposure to covid-19: Secondary | ICD-10-CM | POA: Diagnosis not present

## 2020-12-01 DIAGNOSIS — O99824 Streptococcus B carrier state complicating childbirth: Secondary | ICD-10-CM | POA: Diagnosis not present

## 2020-12-01 DIAGNOSIS — O9832 Other infections with a predominantly sexual mode of transmission complicating childbirth: Secondary | ICD-10-CM | POA: Diagnosis not present

## 2020-12-01 DIAGNOSIS — Z23 Encounter for immunization: Secondary | ICD-10-CM | POA: Diagnosis not present

## 2020-12-01 DIAGNOSIS — O99814 Abnormal glucose complicating childbirth: Secondary | ICD-10-CM | POA: Diagnosis not present

## 2020-12-01 DIAGNOSIS — A6000 Herpesviral infection of urogenital system, unspecified: Secondary | ICD-10-CM | POA: Diagnosis not present

## 2020-12-06 ENCOUNTER — Telehealth: Payer: Self-pay

## 2020-12-06 NOTE — Telephone Encounter (Signed)
Transition Care Management Unsuccessful Follow-up Telephone Call  Date of discharge and from where:  12/03/2020 from [facility]  Attempts:  1st Attempt  Reason for unsuccessful TCM follow-up call:  Left voice message

## 2020-12-07 NOTE — Telephone Encounter (Signed)
Transition Care Management Unsuccessful Follow-up Telephone Call  Date of discharge and from where:  12/03/2020 from [facility]  Attempts:  2nd Attempt  Reason for unsuccessful TCM follow-up call:  Left voice message

## 2020-12-08 NOTE — Telephone Encounter (Signed)
Transition Care Management Unsuccessful Follow-up Telephone Call  Date of discharge and from where:  12/03/2020 from [facility]  Attempts:  3rd Attempt  Reason for unsuccessful TCM follow-up call:  Unable to reach patient

## 2020-12-17 DIAGNOSIS — Z23 Encounter for immunization: Secondary | ICD-10-CM | POA: Diagnosis not present

## 2021-01-06 DIAGNOSIS — R7309 Other abnormal glucose: Secondary | ICD-10-CM | POA: Diagnosis not present

## 2021-01-06 DIAGNOSIS — O99013 Anemia complicating pregnancy, third trimester: Secondary | ICD-10-CM | POA: Diagnosis not present

## 2021-01-06 DIAGNOSIS — Z1389 Encounter for screening for other disorder: Secondary | ICD-10-CM | POA: Diagnosis not present

## 2021-01-06 DIAGNOSIS — D649 Anemia, unspecified: Secondary | ICD-10-CM | POA: Diagnosis not present

## 2021-04-19 IMAGING — US US OB COMP +14 WK
1 series · 13 of 28 positions shown · non-contrast
Comparison: none

CLINICAL DATA: Pregnancy.  Fetal anatomy.

EXAM:
OBSTETRICAL ULTRASOUND >14 WKS

[Series 1: us ob comp + 14 wk · 13 of 86 slices shown]
[im 4/86]
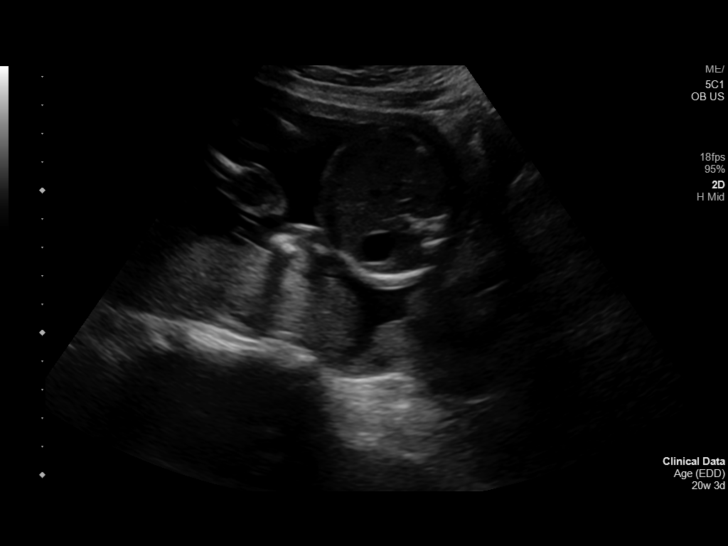
[im 10/86]
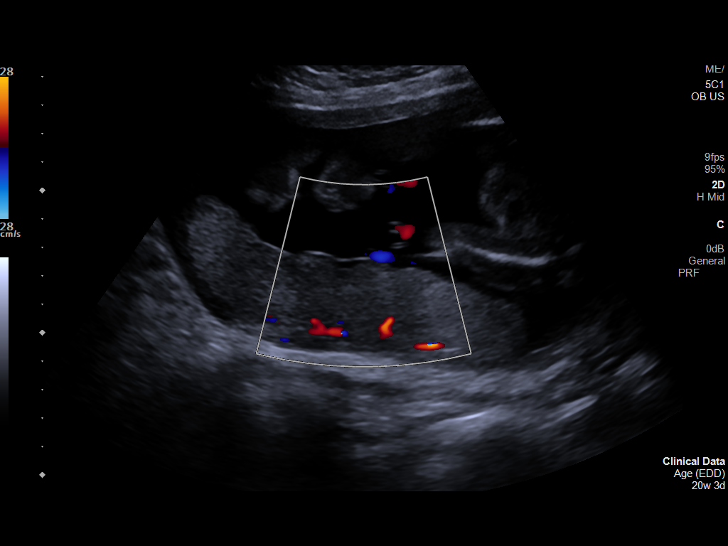
[im 16/86]
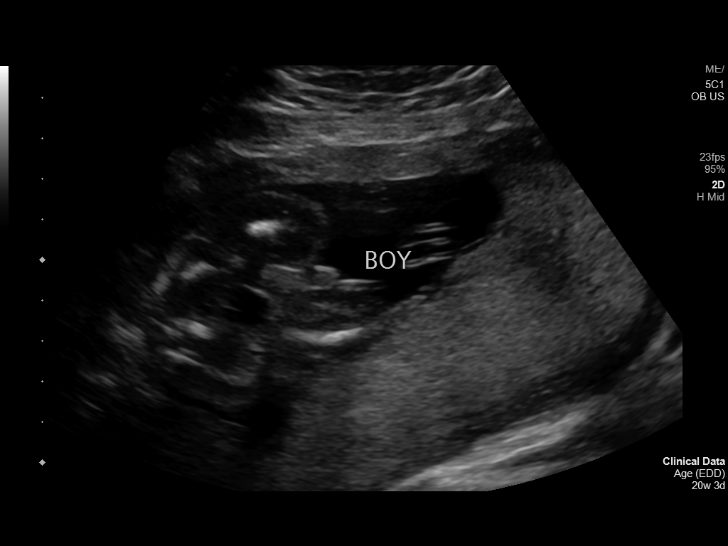
[im 23/86]
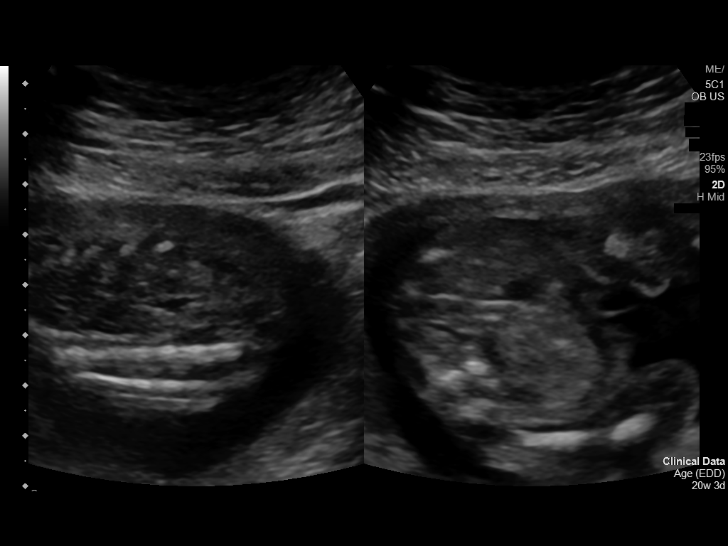
[im 29/86]
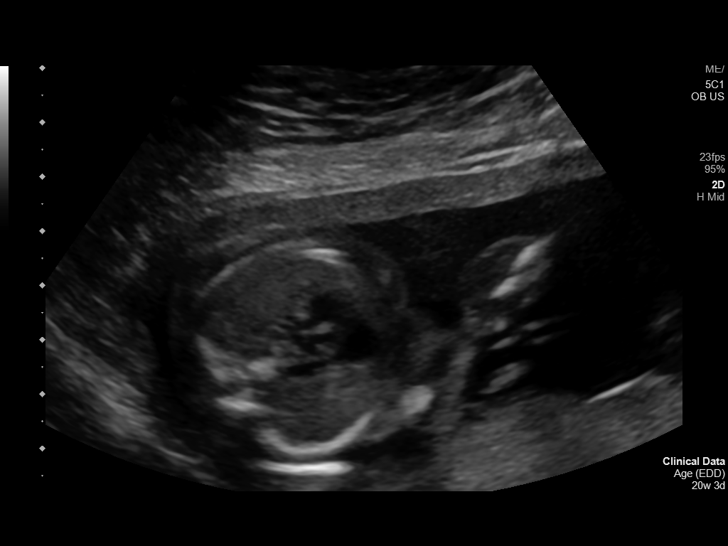
[im 35/86]
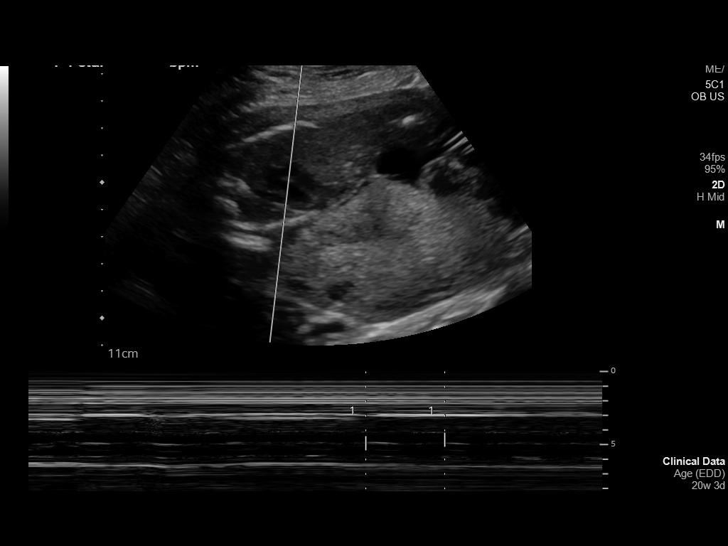
[im 45/86]
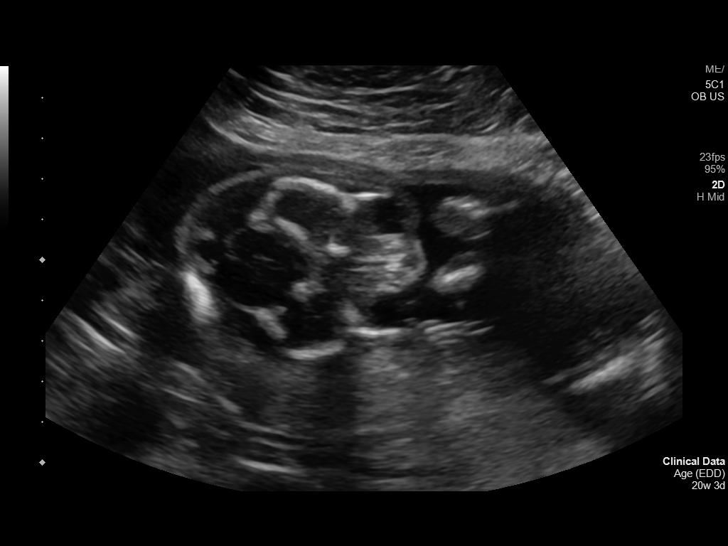
[im 51/86]
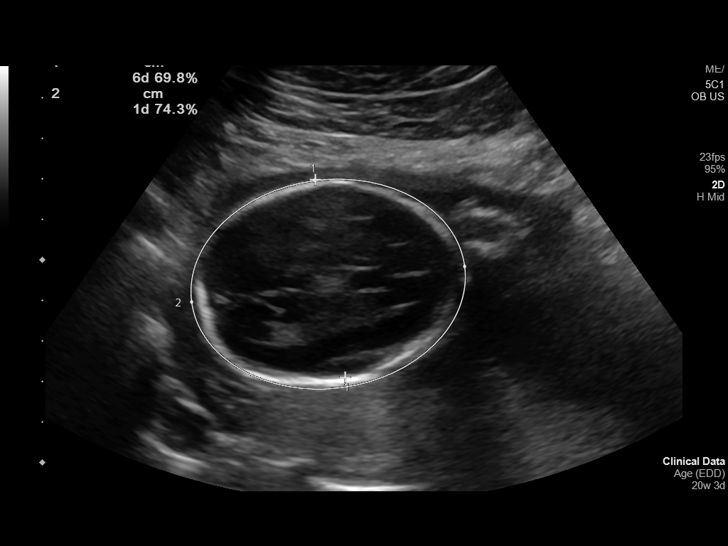
[im 57/86]
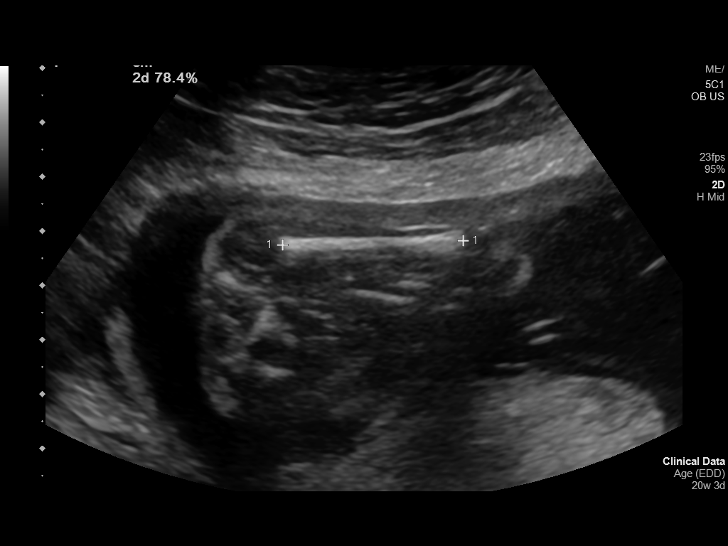
[im 63/86]
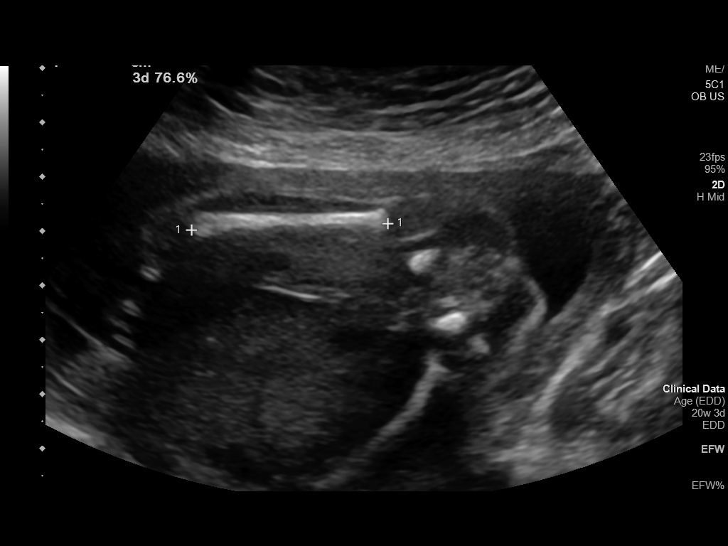
[im 70/86]
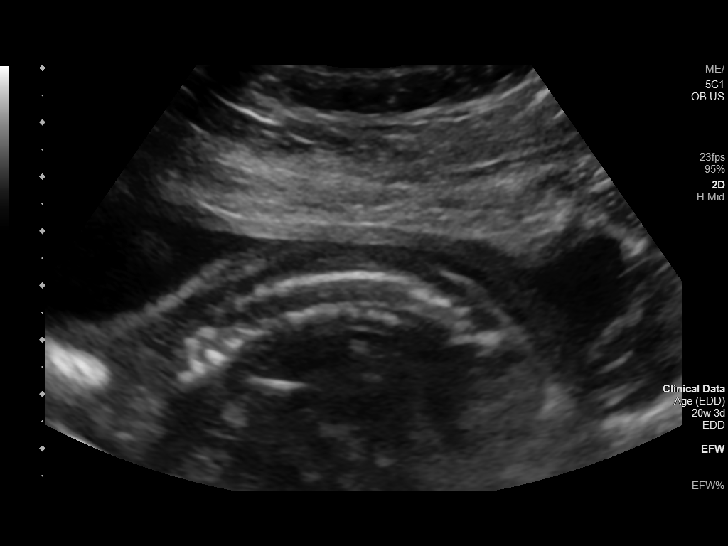
[im 76/86]
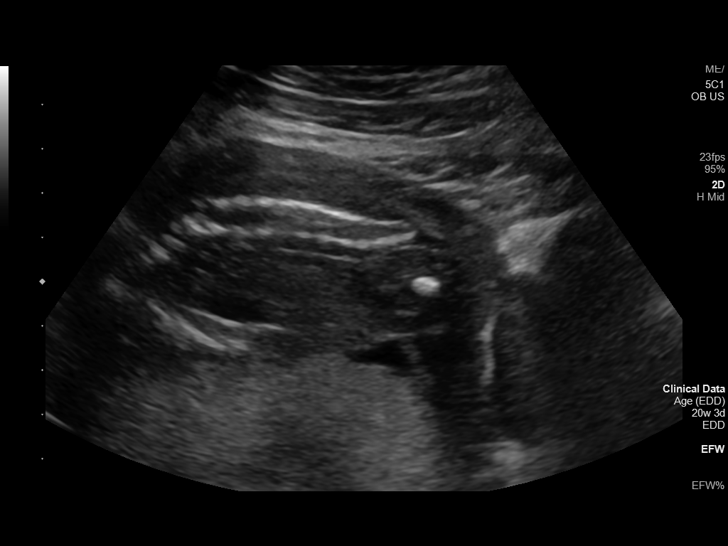
[im 82/86]
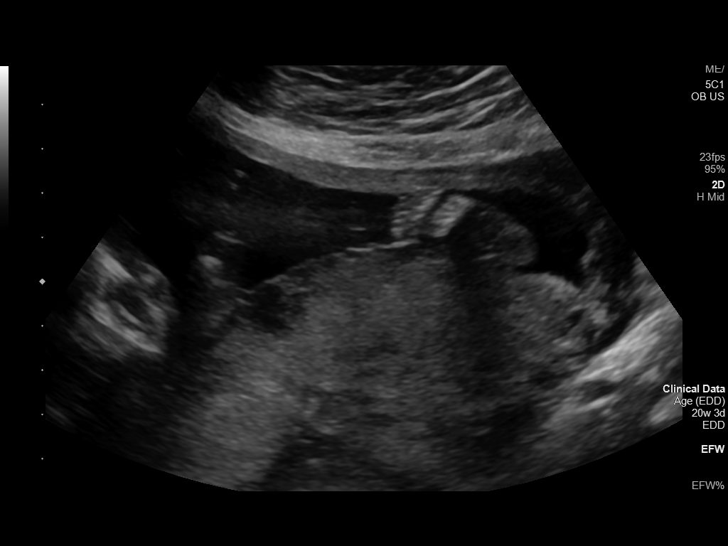

[13 of 28 positions shown; findings below may reference images not displayed]

FINDINGS: Number of Fetuses: 1

Heart Rate:  130 bpm

Movement: Present

Presentation: Breech

Previa: No

Placental Location: Posterior

Amniotic Fluid (Subjective): Normal

Amniotic Fluid (Objective):

Vertical pocket = 3.8cm

FETAL BIOMETRY

BPD: 4.9cm 20w 6d

HC:   18.9cm 21w d

AC:   16.2cm 21w 2d

FL:   3.6cm 21w 2d

Current Mean GA: 20w 6d US EDC: 12/15/2020

Assigned GA:  20w 2d Assigned EDC: 12/19/2020

FETAL ANATOMY

Lateral Ventricles: Appears normal

Thalami/CSP: Appears normal

Posterior Fossa:  Appears normal

Nuchal Region: Appears normal NFT= 4.3 mm

Upper Lip: Appears normal

Spine: Appears normal

4 Chamber Heart on Left: Appears normal

LVOT: Appears normal

RVOT: Appears normal

Stomach on Left: Appears normal

3 Vessel Cord: Appears normal

Cord Insertion site: Appears normal

Kidneys: Appears normal

Bladder: Appears normal

Extremities: Appears normal

Maternal Findings:

Cervix:  4.5 cm and closed
IMPRESSION: Single viable intrauterine pregnancy at 20 weeks 6 days. Fetal heart
rate 130 beats per minute. Breech presentation. No fetal
abnormalities identified.

## 2021-08-09 DIAGNOSIS — L301 Dyshidrosis [pompholyx]: Secondary | ICD-10-CM | POA: Insufficient documentation

## 2021-08-09 DIAGNOSIS — Z8659 Personal history of other mental and behavioral disorders: Secondary | ICD-10-CM | POA: Diagnosis not present

## 2021-08-12 ENCOUNTER — Ambulatory Visit: Payer: Medicaid Other

## 2021-08-15 DIAGNOSIS — Z0000 Encounter for general adult medical examination without abnormal findings: Secondary | ICD-10-CM | POA: Diagnosis not present

## 2021-08-15 DIAGNOSIS — Z87828 Personal history of other (healed) physical injury and trauma: Secondary | ICD-10-CM | POA: Diagnosis not present

## 2021-09-22 DIAGNOSIS — Z113 Encounter for screening for infections with a predominantly sexual mode of transmission: Secondary | ICD-10-CM | POA: Diagnosis not present

## 2021-09-22 DIAGNOSIS — R5383 Other fatigue: Secondary | ICD-10-CM | POA: Diagnosis not present

## 2021-09-22 DIAGNOSIS — N390 Urinary tract infection, site not specified: Secondary | ICD-10-CM | POA: Diagnosis not present

## 2021-09-22 DIAGNOSIS — R102 Pelvic and perineal pain: Secondary | ICD-10-CM | POA: Diagnosis not present

## 2021-09-22 DIAGNOSIS — R634 Abnormal weight loss: Secondary | ICD-10-CM | POA: Diagnosis not present

## 2021-09-22 DIAGNOSIS — R300 Dysuria: Secondary | ICD-10-CM | POA: Diagnosis not present

## 2021-09-22 DIAGNOSIS — R110 Nausea: Secondary | ICD-10-CM | POA: Diagnosis not present

## 2021-09-22 DIAGNOSIS — R109 Unspecified abdominal pain: Secondary | ICD-10-CM | POA: Diagnosis not present

## 2021-09-27 DIAGNOSIS — S99912A Unspecified injury of left ankle, initial encounter: Secondary | ICD-10-CM | POA: Diagnosis not present

## 2021-09-27 DIAGNOSIS — S82832A Other fracture of upper and lower end of left fibula, initial encounter for closed fracture: Secondary | ICD-10-CM | POA: Diagnosis not present

## 2021-09-27 DIAGNOSIS — M7989 Other specified soft tissue disorders: Secondary | ICD-10-CM | POA: Diagnosis not present

## 2021-09-27 DIAGNOSIS — S82392A Other fracture of lower end of left tibia, initial encounter for closed fracture: Secondary | ICD-10-CM | POA: Diagnosis not present

## 2021-09-27 DIAGNOSIS — M25572 Pain in left ankle and joints of left foot: Secondary | ICD-10-CM | POA: Diagnosis not present

## 2021-10-20 DIAGNOSIS — S8265XD Nondisplaced fracture of lateral malleolus of left fibula, subsequent encounter for closed fracture with routine healing: Secondary | ICD-10-CM | POA: Diagnosis not present

## 2021-10-20 DIAGNOSIS — M25572 Pain in left ankle and joints of left foot: Secondary | ICD-10-CM | POA: Diagnosis not present

## 2021-11-25 DIAGNOSIS — Z021 Encounter for pre-employment examination: Secondary | ICD-10-CM | POA: Diagnosis not present

## 2021-11-29 DIAGNOSIS — Z021 Encounter for pre-employment examination: Secondary | ICD-10-CM | POA: Diagnosis not present

## 2022-03-17 ENCOUNTER — Ambulatory Visit: Payer: Medicaid Other

## 2022-03-29 ENCOUNTER — Ambulatory Visit: Payer: Medicaid Other | Admitting: Nurse Practitioner

## 2022-03-29 ENCOUNTER — Ambulatory Visit (LOCAL_COMMUNITY_HEALTH_CENTER): Payer: Medicaid Other | Admitting: Nurse Practitioner

## 2022-03-29 ENCOUNTER — Encounter: Payer: Self-pay | Admitting: Nurse Practitioner

## 2022-03-29 VITALS — BP 115/70 | Ht 60.5 in | Wt 134.0 lb

## 2022-03-29 DIAGNOSIS — Z30013 Encounter for initial prescription of injectable contraceptive: Secondary | ICD-10-CM

## 2022-03-29 DIAGNOSIS — Z309 Encounter for contraceptive management, unspecified: Secondary | ICD-10-CM

## 2022-03-29 DIAGNOSIS — Z113 Encounter for screening for infections with a predominantly sexual mode of transmission: Secondary | ICD-10-CM

## 2022-03-29 LAB — WET PREP FOR TRICH, YEAST, CLUE
Trichomonas Exam: NEGATIVE
Yeast Exam: NEGATIVE

## 2022-03-29 MED ORDER — MEDROXYPROGESTERONE ACETATE 150 MG/ML IM SUSP
150.0000 mg | Freq: Once | INTRAMUSCULAR | Status: AC
  Administered 2022-03-29: 150 mg via INTRAMUSCULAR

## 2022-03-29 NOTE — Progress Notes (Unsigned)
[facility] problem visit  [facility]  Subjective:  Wilhemina Grall is a 26 y.o. being seen today for STD screening and to start birth control.  Chief Complaint  Patient presents with   Contraception    HPI   Does the patient have a current or past history of drug use? No      Health Maintenance Due  Topic Date Due   PAP-Cervical Cytology Screening  Never done   PAP SMEAR-Modifier  Never done   CHLAMYDIA SCREENING  05/11/2021   COVID-19 Vaccine (5 - 2023-24 season) 12/30/2021    Review of Systems  Constitutional:  Negative for chills, fever, malaise/fatigue and weight loss.  HENT:  Negative for congestion, hearing loss and sore throat.   Eyes:  Negative for blurred vision, double vision and photophobia.  Respiratory:  Negative for shortness of breath.   Cardiovascular:  Negative for chest pain.  Gastrointestinal:  Negative for abdominal pain, blood in stool, constipation, diarrhea, heartburn, nausea and vomiting.  Genitourinary:  Negative for dysuria and frequency.  Musculoskeletal:  Negative for back pain, joint pain and neck pain.  Skin:  Negative for itching and rash.  Neurological:  Negative for dizziness, weakness and headaches.  Endo/Heme/Allergies:  Does not bruise/bleed easily.  Psychiatric/Behavioral:  Negative for depression, substance abuse and suicidal ideas.     The following portions of the patient's history were reviewed and updated as appropriate: allergies, current medications, past family history, past medical history, past social history, past surgical history and problem list. Problem list updated.   See flowsheet for other program required questions.  Objective:  There were no vitals filed for this visit.  Physical Exam Constitutional:      Appearance: Normal appearance.  HENT:     Head: Normocephalic. No abrasion, masses or laceration. Hair is normal.     Jaw: No tenderness or swelling.     Right  Ear: External ear normal.     Left Ear: External ear normal.     Nose: Nose normal.     Mouth/Throat:     Lips: Pink. No lesions.     Mouth: Mucous membranes are moist. No lacerations or oral lesions.     Dentition: No dental caries.     Tongue: No lesions.     Palate: No mass and lesions.     Pharynx: No pharyngeal swelling, oropharyngeal exudate, posterior oropharyngeal erythema or uvula swelling.     Tonsils: No tonsillar exudate or tonsillar abscesses.  Eyes:     Pupils: Pupils are equal, round, and reactive to light.  Neck:     Thyroid: No thyroid mass, thyromegaly or thyroid tenderness.  Cardiovascular:     Rate and Rhythm: Normal rate and regular rhythm.  Pulmonary:     Effort: Pulmonary effort is normal.     Breath sounds: Normal breath sounds.  Abdominal:     General: Abdomen is flat. Bowel sounds are normal.     Palpations: Abdomen is soft.     Tenderness: There is no abdominal tenderness. There is no rebound.  Genitourinary:    Pubic Area: No rash or pubic lice.      Labia:        Right: No rash, tenderness or lesion.        Left: No rash, tenderness or lesion.      Vagina: Normal. No vaginal discharge, erythema, tenderness or lesions.     Cervix: No cervical motion tenderness, discharge, lesion or erythema.  Uterus: Normal.      Adnexa:        Right: No tenderness.         Left: No tenderness.       Rectum: Normal.     Comments: Amount Discharge: small  Odor: No pH: less than 4.5 Adheres to vaginal wall: No Color: Thelia Tanksley   Musculoskeletal:     Cervical back: Full passive range of motion without pain and normal range of motion.  Lymphadenopathy:     Cervical: No cervical adenopathy.     Right cervical: No superficial, deep or posterior cervical adenopathy.    Left cervical: No superficial, deep or posterior cervical adenopathy.     Upper Body:     Right upper body: No epitrochlear adenopathy.     Left upper body: No epitrochlear adenopathy.     Lower  Body: No right inguinal adenopathy. No left inguinal adenopathy.  Skin:    General: Skin is warm and dry.     Findings: No erythema, laceration, lesion or rash.  Neurological:     Mental Status: She is alert and oriented to person, place, and time.  Psychiatric:        Attention and Perception: Attention normal.        Mood and Affect: Mood normal.        Speech: Speech normal.        Behavior: Behavior normal. Behavior is cooperative.       Assessment and Plan:  Argelia Formisano is a 27 y.o. female presenting to the [facility] Department for a Women's Health problem visit  1. Encounter for initial prescription of injectable contraceptive -26 year old female in clinic today to receive STD screening and Depo. -ROS reviewed, no complaints. -Patient has not been using condoms with sex.  Last LMP 03/20/22.  Last unprotected sex 05/25/21, patient states she took a Plan B on 03/27/22.   -Ok for patient to have Depo today.  Advised patient to schedule an appointment for a physical at next scheduled Depo for continuation of method.   - medroxyPROGESTERone (DEPO-PROVERA) injection 150 mg   Total time spent: 30 minutes   Return in about 11 weeks (around 06/14/2022) for Routine DMPA injection and physical appointment.   Glenna Fellows, FNP

## 2022-03-29 NOTE — Progress Notes (Signed)
Pt. seen for routine STI screening. Wet mount results reviewed with pt. Condoms declined, no tx indicated. Pt. Received depo IM injection in left deltoid.
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
## 2022-05-24 DIAGNOSIS — J101 Influenza due to other identified influenza virus with other respiratory manifestations: Secondary | ICD-10-CM | POA: Diagnosis not present

## 2022-05-24 DIAGNOSIS — J028 Acute pharyngitis due to other specified organisms: Secondary | ICD-10-CM | POA: Diagnosis not present

## 2022-05-24 DIAGNOSIS — Z03818 Encounter for observation for suspected exposure to other biological agents ruled out: Secondary | ICD-10-CM | POA: Diagnosis not present

## 2022-06-14 DIAGNOSIS — B9689 Other specified bacterial agents as the cause of diseases classified elsewhere: Secondary | ICD-10-CM | POA: Diagnosis not present

## 2022-06-14 DIAGNOSIS — B3731 Acute candidiasis of vulva and vagina: Secondary | ICD-10-CM | POA: Diagnosis not present

## 2022-06-14 DIAGNOSIS — N760 Acute vaginitis: Secondary | ICD-10-CM | POA: Diagnosis not present

## 2022-06-17 DIAGNOSIS — A64 Unspecified sexually transmitted disease: Secondary | ICD-10-CM | POA: Diagnosis not present

## 2022-06-17 DIAGNOSIS — N9489 Other specified conditions associated with female genital organs and menstrual cycle: Secondary | ICD-10-CM | POA: Diagnosis not present

## 2022-06-17 DIAGNOSIS — N760 Acute vaginitis: Secondary | ICD-10-CM | POA: Diagnosis not present

## 2022-06-21 DIAGNOSIS — R300 Dysuria: Secondary | ICD-10-CM | POA: Diagnosis not present

## 2022-06-21 DIAGNOSIS — A64 Unspecified sexually transmitted disease: Secondary | ICD-10-CM | POA: Diagnosis not present

## 2022-06-28 ENCOUNTER — Ambulatory Visit: Payer: Medicaid Other | Admitting: Family Medicine

## 2022-07-05 ENCOUNTER — Ambulatory Visit (LOCAL_COMMUNITY_HEALTH_CENTER): Payer: Medicaid Other | Admitting: Family Medicine

## 2022-07-05 ENCOUNTER — Encounter: Payer: Self-pay | Admitting: Family Medicine

## 2022-07-05 ENCOUNTER — Ambulatory Visit: Payer: Medicaid Other

## 2022-07-05 VITALS — BP 106/71 | HR 74 | Ht 60.5 in | Wt 139.4 lb

## 2022-07-05 DIAGNOSIS — Z124 Encounter for screening for malignant neoplasm of cervix: Secondary | ICD-10-CM | POA: Diagnosis not present

## 2022-07-05 DIAGNOSIS — N9489 Other specified conditions associated with female genital organs and menstrual cycle: Secondary | ICD-10-CM | POA: Diagnosis not present

## 2022-07-05 DIAGNOSIS — Z309 Encounter for contraceptive management, unspecified: Secondary | ICD-10-CM | POA: Diagnosis not present

## 2022-07-05 DIAGNOSIS — Z113 Encounter for screening for infections with a predominantly sexual mode of transmission: Secondary | ICD-10-CM

## 2022-07-05 DIAGNOSIS — Z01419 Encounter for gynecological examination (general) (routine) without abnormal findings: Secondary | ICD-10-CM

## 2022-07-05 DIAGNOSIS — Z30013 Encounter for initial prescription of injectable contraceptive: Secondary | ICD-10-CM | POA: Diagnosis not present

## 2022-07-05 DIAGNOSIS — A64 Unspecified sexually transmitted disease: Secondary | ICD-10-CM | POA: Diagnosis not present

## 2022-07-05 DIAGNOSIS — N760 Acute vaginitis: Secondary | ICD-10-CM | POA: Diagnosis not present

## 2022-07-05 DIAGNOSIS — Z3009 Encounter for other general counseling and advice on contraception: Secondary | ICD-10-CM

## 2022-07-05 LAB — HM HIV SCREENING LAB: HM HIV Screening: NEGATIVE

## 2022-07-05 LAB — WET PREP FOR TRICH, YEAST, CLUE
Trichomonas Exam: NEGATIVE
Yeast Exam: NEGATIVE

## 2022-07-05 MED ORDER — MEDROXYPROGESTERONE ACETATE 150 MG/ML IM SUSP
150.0000 mg | INTRAMUSCULAR | Status: AC
  Administered 2022-07-05: 150 mg via INTRAMUSCULAR

## 2022-07-05 NOTE — Progress Notes (Signed)
Pt is here for PE, pap smear, STD testing and Depo.   Depo 150 mg given IM in Rt deltoid.  Pt tolerated well. Wet mount results reviewed, no treatment required per SO. FP packet given.  Windle Guard, RN
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
## 2022-07-13 LAB — IGP, RFX APTIMA HPV ASCU: PAP Smear Comment: 0

## 2022-10-27 DIAGNOSIS — Z8659 Personal history of other mental and behavioral disorders: Secondary | ICD-10-CM | POA: Diagnosis not present

## 2022-10-27 DIAGNOSIS — D500 Iron deficiency anemia secondary to blood loss (chronic): Secondary | ICD-10-CM | POA: Diagnosis not present

## 2022-10-27 DIAGNOSIS — N9089 Other specified noninflammatory disorders of vulva and perineum: Secondary | ICD-10-CM | POA: Diagnosis not present

## 2023-02-20 DIAGNOSIS — U071 COVID-19: Secondary | ICD-10-CM | POA: Diagnosis not present

## 2023-03-05 DIAGNOSIS — Z23 Encounter for immunization: Secondary | ICD-10-CM | POA: Diagnosis not present

## 2023-03-27 DIAGNOSIS — R21 Rash and other nonspecific skin eruption: Secondary | ICD-10-CM | POA: Diagnosis not present

## 2023-03-27 DIAGNOSIS — R110 Nausea: Secondary | ICD-10-CM | POA: Diagnosis not present

## 2023-03-28 DIAGNOSIS — B349 Viral infection, unspecified: Secondary | ICD-10-CM | POA: Diagnosis not present

## 2023-03-28 DIAGNOSIS — R059 Cough, unspecified: Secondary | ICD-10-CM | POA: Diagnosis not present

## 2023-03-28 DIAGNOSIS — Z20822 Contact with and (suspected) exposure to covid-19: Secondary | ICD-10-CM | POA: Diagnosis not present

## 2023-03-28 DIAGNOSIS — R112 Nausea with vomiting, unspecified: Secondary | ICD-10-CM | POA: Diagnosis not present

## 2023-03-28 DIAGNOSIS — R000 Tachycardia, unspecified: Secondary | ICD-10-CM | POA: Diagnosis not present

## 2023-03-28 DIAGNOSIS — R1084 Generalized abdominal pain: Secondary | ICD-10-CM | POA: Diagnosis not present

## 2023-03-28 DIAGNOSIS — R509 Fever, unspecified: Secondary | ICD-10-CM | POA: Diagnosis not present

## 2023-05-07 DIAGNOSIS — Z8659 Personal history of other mental and behavioral disorders: Secondary | ICD-10-CM | POA: Diagnosis not present

## 2023-05-07 DIAGNOSIS — E663 Overweight: Secondary | ICD-10-CM | POA: Diagnosis not present

## 2023-05-07 DIAGNOSIS — Z6828 Body mass index (BMI) 28.0-28.9, adult: Secondary | ICD-10-CM | POA: Diagnosis not present

## 2023-05-07 DIAGNOSIS — D649 Anemia, unspecified: Secondary | ICD-10-CM | POA: Diagnosis not present

## 2023-06-19 DIAGNOSIS — Z6827 Body mass index (BMI) 27.0-27.9, adult: Secondary | ICD-10-CM | POA: Diagnosis not present

## 2023-06-19 DIAGNOSIS — R21 Rash and other nonspecific skin eruption: Secondary | ICD-10-CM | POA: Diagnosis not present

## 2023-06-19 DIAGNOSIS — E663 Overweight: Secondary | ICD-10-CM | POA: Diagnosis not present

## 2023-07-30 DIAGNOSIS — Z6826 Body mass index (BMI) 26.0-26.9, adult: Secondary | ICD-10-CM | POA: Diagnosis not present

## 2023-07-30 DIAGNOSIS — Z3009 Encounter for other general counseling and advice on contraception: Secondary | ICD-10-CM | POA: Diagnosis not present

## 2023-07-30 DIAGNOSIS — E663 Overweight: Secondary | ICD-10-CM | POA: Diagnosis not present

## 2023-08-03 DIAGNOSIS — N9089 Other specified noninflammatory disorders of vulva and perineum: Secondary | ICD-10-CM | POA: Diagnosis not present

## 2023-08-03 DIAGNOSIS — L83 Acanthosis nigricans: Secondary | ICD-10-CM | POA: Diagnosis not present

## 2023-08-15 DIAGNOSIS — Z021 Encounter for pre-employment examination: Secondary | ICD-10-CM | POA: Diagnosis not present

## 2023-08-15 DIAGNOSIS — R21 Rash and other nonspecific skin eruption: Secondary | ICD-10-CM | POA: Diagnosis not present

## 2024-01-02 DIAGNOSIS — Z6827 Body mass index (BMI) 27.0-27.9, adult: Secondary | ICD-10-CM | POA: Diagnosis not present

## 2024-01-02 DIAGNOSIS — F32A Depression, unspecified: Secondary | ICD-10-CM | POA: Diagnosis not present

## 2024-01-02 DIAGNOSIS — Z113 Encounter for screening for infections with a predominantly sexual mode of transmission: Secondary | ICD-10-CM | POA: Diagnosis not present

## 2024-01-02 DIAGNOSIS — Z23 Encounter for immunization: Secondary | ICD-10-CM | POA: Diagnosis not present

## 2024-01-02 DIAGNOSIS — E663 Overweight: Secondary | ICD-10-CM | POA: Diagnosis not present

## 2024-01-02 DIAGNOSIS — N898 Other specified noninflammatory disorders of vagina: Secondary | ICD-10-CM | POA: Diagnosis not present

## 2024-02-13 DIAGNOSIS — R590 Localized enlarged lymph nodes: Secondary | ICD-10-CM | POA: Diagnosis not present

## 2024-03-12 DIAGNOSIS — L509 Urticaria, unspecified: Secondary | ICD-10-CM | POA: Diagnosis not present

## 2024-03-12 DIAGNOSIS — L918 Other hypertrophic disorders of the skin: Secondary | ICD-10-CM | POA: Diagnosis not present

## 2024-04-11 DIAGNOSIS — F32A Depression, unspecified: Secondary | ICD-10-CM | POA: Diagnosis not present

## 2024-04-11 DIAGNOSIS — E663 Overweight: Secondary | ICD-10-CM | POA: Diagnosis not present

## 2024-04-11 DIAGNOSIS — Z6827 Body mass index (BMI) 27.0-27.9, adult: Secondary | ICD-10-CM | POA: Diagnosis not present
